# Patient Record
Sex: Male | Born: 1940 | Race: White | Hispanic: No | Marital: Married | Smoking: Never smoker
Health system: Southern US, Community
[De-identification: ages and names within clinical notes are randomized; demographics above are authoritative.]

## PROBLEM LIST (undated history)

## (undated) DIAGNOSIS — I491 Atrial premature depolarization: Secondary | ICD-10-CM

## (undated) DIAGNOSIS — E119 Type 2 diabetes mellitus without complications: Secondary | ICD-10-CM

## (undated) DIAGNOSIS — C801 Malignant (primary) neoplasm, unspecified: Secondary | ICD-10-CM

## (undated) DIAGNOSIS — Q231 Congenital insufficiency of aortic valve: Secondary | ICD-10-CM

## (undated) DIAGNOSIS — Z01810 Encounter for preprocedural cardiovascular examination: Secondary | ICD-10-CM

## (undated) DIAGNOSIS — I10 Essential (primary) hypertension: Secondary | ICD-10-CM

## (undated) DIAGNOSIS — N289 Disorder of kidney and ureter, unspecified: Secondary | ICD-10-CM

## (undated) DIAGNOSIS — M549 Dorsalgia, unspecified: Secondary | ICD-10-CM

## (undated) DIAGNOSIS — E7800 Pure hypercholesterolemia, unspecified: Secondary | ICD-10-CM

## (undated) DIAGNOSIS — I2510 Atherosclerotic heart disease of native coronary artery without angina pectoris: Secondary | ICD-10-CM

## (undated) DIAGNOSIS — M4804 Spinal stenosis, thoracic region: Secondary | ICD-10-CM

## (undated) DIAGNOSIS — R002 Palpitations: Secondary | ICD-10-CM

## (undated) DIAGNOSIS — E669 Obesity, unspecified: Secondary | ICD-10-CM

## (undated) DIAGNOSIS — J9602 Acute respiratory failure with hypercapnia: Secondary | ICD-10-CM

## (undated) DIAGNOSIS — J9601 Acute respiratory failure with hypoxia: Secondary | ICD-10-CM

## (undated) HISTORY — DX: Atrial premature depolarization: I49.1

## (undated) HISTORY — PX: NEPHRECTOMY: SHX65

## (undated) HISTORY — DX: Acute respiratory failure with hypercapnia: J96.02

## (undated) HISTORY — DX: Pure hypercholesterolemia, unspecified: E78.00

## (undated) HISTORY — DX: Essential (primary) hypertension: I10

## (undated) HISTORY — DX: Atherosclerotic heart disease of native coronary artery without angina pectoris: I25.10

## (undated) HISTORY — PX: HERNIA REPAIR: SHX51

## (undated) HISTORY — DX: Congenital insufficiency of aortic valve: Q23.1

## (undated) HISTORY — PX: ROTATOR CUFF REPAIR: SHX139

## (undated) HISTORY — DX: Acute respiratory failure with hypoxia: J96.01

## (undated) HISTORY — DX: Palpitations: R00.2

## (undated) HISTORY — DX: Obesity, unspecified: E66.9

## (undated) HISTORY — DX: Encounter for preprocedural cardiovascular examination: Z01.810

---

## 2014-03-03 DIAGNOSIS — I2510 Atherosclerotic heart disease of native coronary artery without angina pectoris: Secondary | ICD-10-CM | POA: Diagnosis not present

## 2014-03-03 DIAGNOSIS — I7090 Unspecified atherosclerosis: Secondary | ICD-10-CM | POA: Diagnosis not present

## 2014-03-03 DIAGNOSIS — C642 Malignant neoplasm of left kidney, except renal pelvis: Secondary | ICD-10-CM | POA: Diagnosis not present

## 2014-03-07 DIAGNOSIS — Z85528 Personal history of other malignant neoplasm of kidney: Secondary | ICD-10-CM | POA: Diagnosis not present

## 2014-03-28 DIAGNOSIS — M549 Dorsalgia, unspecified: Secondary | ICD-10-CM | POA: Diagnosis not present

## 2014-03-28 DIAGNOSIS — E119 Type 2 diabetes mellitus without complications: Secondary | ICD-10-CM | POA: Diagnosis not present

## 2014-03-28 DIAGNOSIS — G894 Chronic pain syndrome: Secondary | ICD-10-CM | POA: Diagnosis not present

## 2014-03-28 DIAGNOSIS — N183 Chronic kidney disease, stage 3 (moderate): Secondary | ICD-10-CM | POA: Diagnosis not present

## 2014-03-28 DIAGNOSIS — E785 Hyperlipidemia, unspecified: Secondary | ICD-10-CM | POA: Diagnosis not present

## 2014-04-05 DIAGNOSIS — E119 Type 2 diabetes mellitus without complications: Secondary | ICD-10-CM | POA: Diagnosis not present

## 2014-04-05 DIAGNOSIS — G510 Bell's palsy: Secondary | ICD-10-CM | POA: Diagnosis not present

## 2014-04-05 DIAGNOSIS — I10 Essential (primary) hypertension: Secondary | ICD-10-CM | POA: Diagnosis not present

## 2014-04-20 DIAGNOSIS — E109 Type 1 diabetes mellitus without complications: Secondary | ICD-10-CM | POA: Diagnosis not present

## 2014-04-25 DIAGNOSIS — M549 Dorsalgia, unspecified: Secondary | ICD-10-CM | POA: Diagnosis not present

## 2014-04-25 DIAGNOSIS — E785 Hyperlipidemia, unspecified: Secondary | ICD-10-CM | POA: Diagnosis not present

## 2014-04-25 DIAGNOSIS — Z79899 Other long term (current) drug therapy: Secondary | ICD-10-CM | POA: Diagnosis not present

## 2014-04-25 DIAGNOSIS — G894 Chronic pain syndrome: Secondary | ICD-10-CM | POA: Diagnosis not present

## 2014-04-25 DIAGNOSIS — I10 Essential (primary) hypertension: Secondary | ICD-10-CM | POA: Diagnosis not present

## 2014-05-12 DIAGNOSIS — I10 Essential (primary) hypertension: Secondary | ICD-10-CM | POA: Diagnosis not present

## 2014-05-12 DIAGNOSIS — E780 Pure hypercholesterolemia: Secondary | ICD-10-CM | POA: Diagnosis not present

## 2014-05-12 DIAGNOSIS — I2510 Atherosclerotic heart disease of native coronary artery without angina pectoris: Secondary | ICD-10-CM | POA: Diagnosis not present

## 2014-05-25 DIAGNOSIS — E119 Type 2 diabetes mellitus without complications: Secondary | ICD-10-CM | POA: Diagnosis not present

## 2014-05-25 DIAGNOSIS — I10 Essential (primary) hypertension: Secondary | ICD-10-CM | POA: Diagnosis not present

## 2014-05-25 DIAGNOSIS — E785 Hyperlipidemia, unspecified: Secondary | ICD-10-CM | POA: Diagnosis not present

## 2014-05-25 DIAGNOSIS — M549 Dorsalgia, unspecified: Secondary | ICD-10-CM | POA: Diagnosis not present

## 2014-05-25 DIAGNOSIS — G894 Chronic pain syndrome: Secondary | ICD-10-CM | POA: Diagnosis not present

## 2014-06-06 DIAGNOSIS — Z85528 Personal history of other malignant neoplasm of kidney: Secondary | ICD-10-CM | POA: Diagnosis not present

## 2014-06-22 DIAGNOSIS — Z79899 Other long term (current) drug therapy: Secondary | ICD-10-CM | POA: Diagnosis not present

## 2014-06-22 DIAGNOSIS — E119 Type 2 diabetes mellitus without complications: Secondary | ICD-10-CM | POA: Diagnosis not present

## 2014-06-22 DIAGNOSIS — I10 Essential (primary) hypertension: Secondary | ICD-10-CM | POA: Diagnosis not present

## 2014-06-22 DIAGNOSIS — M549 Dorsalgia, unspecified: Secondary | ICD-10-CM | POA: Diagnosis not present

## 2014-06-30 DIAGNOSIS — E780 Pure hypercholesterolemia: Secondary | ICD-10-CM | POA: Diagnosis not present

## 2014-07-19 DIAGNOSIS — E109 Type 1 diabetes mellitus without complications: Secondary | ICD-10-CM | POA: Diagnosis not present

## 2014-07-25 DIAGNOSIS — I10 Essential (primary) hypertension: Secondary | ICD-10-CM | POA: Diagnosis not present

## 2014-07-25 DIAGNOSIS — Z79899 Other long term (current) drug therapy: Secondary | ICD-10-CM | POA: Diagnosis not present

## 2014-07-25 DIAGNOSIS — E119 Type 2 diabetes mellitus without complications: Secondary | ICD-10-CM | POA: Diagnosis not present

## 2014-07-25 DIAGNOSIS — G894 Chronic pain syndrome: Secondary | ICD-10-CM | POA: Diagnosis not present

## 2014-07-25 DIAGNOSIS — E785 Hyperlipidemia, unspecified: Secondary | ICD-10-CM | POA: Diagnosis not present

## 2014-08-24 ENCOUNTER — Inpatient Hospital Stay (HOSPITAL_COMMUNITY): Payer: Medicare Other

## 2014-08-24 ENCOUNTER — Inpatient Hospital Stay (HOSPITAL_COMMUNITY)
Admission: EM | Admit: 2014-08-24 | Discharge: 2014-08-27 | DRG: 871 | Discharge status: Against Medical Advice | Payer: Medicare Other | Attending: Internal Medicine | Admitting: Internal Medicine

## 2014-08-24 ENCOUNTER — Encounter (HOSPITAL_COMMUNITY): Payer: Self-pay | Admitting: Emergency Medicine

## 2014-08-24 ENCOUNTER — Emergency Department (HOSPITAL_COMMUNITY): Payer: Medicare Other

## 2014-08-24 DIAGNOSIS — J189 Pneumonia, unspecified organism: Secondary | ICD-10-CM | POA: Diagnosis not present

## 2014-08-24 DIAGNOSIS — N179 Acute kidney failure, unspecified: Secondary | ICD-10-CM | POA: Diagnosis not present

## 2014-08-24 DIAGNOSIS — Z9889 Other specified postprocedural states: Secondary | ICD-10-CM | POA: Diagnosis not present

## 2014-08-24 DIAGNOSIS — S299XXA Unspecified injury of thorax, initial encounter: Secondary | ICD-10-CM | POA: Diagnosis not present

## 2014-08-24 DIAGNOSIS — I959 Hypotension, unspecified: Secondary | ICD-10-CM | POA: Diagnosis not present

## 2014-08-24 DIAGNOSIS — Z6841 Body Mass Index (BMI) 40.0 and over, adult: Secondary | ICD-10-CM | POA: Diagnosis not present

## 2014-08-24 DIAGNOSIS — Z905 Acquired absence of kidney: Secondary | ICD-10-CM | POA: Diagnosis not present

## 2014-08-24 DIAGNOSIS — G8929 Other chronic pain: Secondary | ICD-10-CM | POA: Diagnosis present

## 2014-08-24 DIAGNOSIS — J90 Pleural effusion, not elsewhere classified: Secondary | ICD-10-CM | POA: Diagnosis not present

## 2014-08-24 DIAGNOSIS — M6282 Rhabdomyolysis: Secondary | ICD-10-CM | POA: Diagnosis not present

## 2014-08-24 DIAGNOSIS — I248 Other forms of acute ischemic heart disease: Secondary | ICD-10-CM | POA: Diagnosis present

## 2014-08-24 DIAGNOSIS — Z85528 Personal history of other malignant neoplasm of kidney: Secondary | ICD-10-CM

## 2014-08-24 DIAGNOSIS — R0682 Tachypnea, not elsewhere classified: Secondary | ICD-10-CM | POA: Diagnosis not present

## 2014-08-24 DIAGNOSIS — I444 Left anterior fascicular block: Secondary | ICD-10-CM | POA: Diagnosis present

## 2014-08-24 DIAGNOSIS — I129 Hypertensive chronic kidney disease with stage 1 through stage 4 chronic kidney disease, or unspecified chronic kidney disease: Secondary | ICD-10-CM | POA: Diagnosis not present

## 2014-08-24 DIAGNOSIS — F17220 Nicotine dependence, chewing tobacco, uncomplicated: Secondary | ICD-10-CM | POA: Diagnosis present

## 2014-08-24 DIAGNOSIS — J9690 Respiratory failure, unspecified, unspecified whether with hypoxia or hypercapnia: Secondary | ICD-10-CM

## 2014-08-24 DIAGNOSIS — E872 Acidosis: Secondary | ICD-10-CM | POA: Diagnosis present

## 2014-08-24 DIAGNOSIS — M25551 Pain in right hip: Secondary | ICD-10-CM | POA: Diagnosis not present

## 2014-08-24 DIAGNOSIS — J9811 Atelectasis: Secondary | ICD-10-CM | POA: Diagnosis not present

## 2014-08-24 DIAGNOSIS — Z452 Encounter for adjustment and management of vascular access device: Secondary | ICD-10-CM | POA: Diagnosis not present

## 2014-08-24 DIAGNOSIS — R0602 Shortness of breath: Secondary | ICD-10-CM | POA: Diagnosis not present

## 2014-08-24 DIAGNOSIS — A419 Sepsis, unspecified organism: Secondary | ICD-10-CM | POA: Diagnosis not present

## 2014-08-24 DIAGNOSIS — J9601 Acute respiratory failure with hypoxia: Secondary | ICD-10-CM | POA: Diagnosis not present

## 2014-08-24 DIAGNOSIS — T40601A Poisoning by unspecified narcotics, accidental (unintentional), initial encounter: Secondary | ICD-10-CM | POA: Diagnosis not present

## 2014-08-24 DIAGNOSIS — J9602 Acute respiratory failure with hypercapnia: Secondary | ICD-10-CM | POA: Diagnosis not present

## 2014-08-24 DIAGNOSIS — G9341 Metabolic encephalopathy: Secondary | ICD-10-CM | POA: Diagnosis present

## 2014-08-24 DIAGNOSIS — R4182 Altered mental status, unspecified: Secondary | ICD-10-CM | POA: Diagnosis not present

## 2014-08-24 DIAGNOSIS — N189 Chronic kidney disease, unspecified: Secondary | ICD-10-CM | POA: Diagnosis not present

## 2014-08-24 DIAGNOSIS — E1122 Type 2 diabetes mellitus with diabetic chronic kidney disease: Secondary | ICD-10-CM | POA: Diagnosis not present

## 2014-08-24 DIAGNOSIS — R404 Transient alteration of awareness: Secondary | ICD-10-CM | POA: Diagnosis not present

## 2014-08-24 DIAGNOSIS — R531 Weakness: Secondary | ICD-10-CM | POA: Diagnosis not present

## 2014-08-24 DIAGNOSIS — S0990XA Unspecified injury of head, initial encounter: Secondary | ICD-10-CM | POA: Diagnosis not present

## 2014-08-24 DIAGNOSIS — M4804 Spinal stenosis, thoracic region: Secondary | ICD-10-CM | POA: Diagnosis present

## 2014-08-24 DIAGNOSIS — S79911A Unspecified injury of right hip, initial encounter: Secondary | ICD-10-CM | POA: Diagnosis not present

## 2014-08-24 HISTORY — DX: Disorder of kidney and ureter, unspecified: N28.9

## 2014-08-24 HISTORY — DX: Acute respiratory failure with hypoxia: J96.01

## 2014-08-24 HISTORY — DX: Malignant (primary) neoplasm, unspecified: C80.1

## 2014-08-24 HISTORY — DX: Essential (primary) hypertension: I10

## 2014-08-24 HISTORY — DX: Type 2 diabetes mellitus without complications: E11.9

## 2014-08-24 HISTORY — DX: Dorsalgia, unspecified: M54.9

## 2014-08-24 HISTORY — DX: Spinal stenosis, thoracic region: M48.04

## 2014-08-24 LAB — URINALYSIS, ROUTINE W REFLEX MICROSCOPIC
Bilirubin Urine: NEGATIVE
Bilirubin Urine: NEGATIVE
GLUCOSE, UA: NEGATIVE mg/dL
Glucose, UA: 100 mg/dL — AB
Ketones, ur: NEGATIVE mg/dL
Ketones, ur: NEGATIVE mg/dL
LEUKOCYTES UA: NEGATIVE
Leukocytes, UA: NEGATIVE
NITRITE: NEGATIVE
Nitrite: NEGATIVE
PH: 5.5 (ref 5.0–8.0)
Protein, ur: 100 mg/dL — AB
Protein, ur: 100 mg/dL — AB
SPECIFIC GRAVITY, URINE: 1.008 (ref 1.005–1.030)
Specific Gravity, Urine: 1.02 (ref 1.005–1.030)
Urobilinogen, UA: 1 mg/dL (ref 0.0–1.0)
Urobilinogen, UA: 0.2 mg/dL (ref 0.0–1.0)
pH: 5 (ref 5.0–8.0)

## 2014-08-24 LAB — I-STAT ARTERIAL BLOOD GAS, ED
Acid-base deficit: 8 mmol/L — ABNORMAL HIGH (ref 0.0–2.0)
Acid-base deficit: 5 mmol/L — ABNORMAL HIGH (ref 0.0–2.0)
Bicarbonate: 21.3 mEq/L (ref 20.0–24.0)
Bicarbonate: 24.9 mEq/L — ABNORMAL HIGH (ref 20.0–24.0)
O2 Saturation: 90 %
O2 Saturation: 100 %
PCO2 ART: 64.8 mmHg — AB (ref 35.0–45.0)
PH ART: 7.195 — AB (ref 7.350–7.450)
Patient temperature: 99.4
TCO2: 23 mmol/L (ref 0–100)
TCO2: 27 mmol/L (ref 0–100)
pCO2 arterial: 55.2 mmHg — ABNORMAL HIGH (ref 35.0–45.0)
pH, Arterial: 7.196 — CL (ref 7.350–7.450)
pO2, Arterial: 73 mmHg — ABNORMAL LOW (ref 80.0–100.0)
pO2, Arterial: 261 mmHg — ABNORMAL HIGH (ref 80.0–100.0)

## 2014-08-24 LAB — COMPREHENSIVE METABOLIC PANEL
ALT: 26 U/L (ref 17–63)
AST: 77 U/L — ABNORMAL HIGH (ref 15–41)
Albumin: 3.6 g/dL (ref 3.5–5.0)
Alkaline Phosphatase: 63 U/L (ref 38–126)
Anion gap: 13 (ref 5–15)
BUN: 27 mg/dL — ABNORMAL HIGH (ref 6–20)
CO2: 20 mmol/L — ABNORMAL LOW (ref 22–32)
Calcium: 8.8 mg/dL — ABNORMAL LOW (ref 8.9–10.3)
Chloride: 105 mmol/L (ref 101–111)
Creatinine, Ser: 3.18 mg/dL — ABNORMAL HIGH (ref 0.61–1.24)
GFR calc Af Amer: 21 mL/min — ABNORMAL LOW (ref >60)
GFR calc non Af Amer: 18 mL/min — ABNORMAL LOW (ref >60)
Glucose, Bld: 147 mg/dL — ABNORMAL HIGH (ref 65–99)
Potassium: 4.6 mmol/L (ref 3.5–5.1)
Sodium: 138 mmol/L (ref 135–145)
Total Bilirubin: 0.6 mg/dL (ref 0.3–1.2)
Total Protein: 7.2 g/dL (ref 6.5–8.1)

## 2014-08-24 LAB — LACTIC ACID, PLASMA: LACTIC ACID, VENOUS: 1.4 mmol/L (ref 0.5–2.0)

## 2014-08-24 LAB — I-STAT TROPONIN, ED: Troponin i, poc: 0.13 ng/mL (ref 0.00–0.08)

## 2014-08-24 LAB — CBC WITH DIFFERENTIAL/PLATELET
Basophils Absolute: 0.1 10*3/uL (ref 0.0–0.1)
Basophils Relative: 0 % (ref 0–1)
Eosinophils Absolute: 0.1 10*3/uL (ref 0.0–0.7)
Eosinophils Relative: 0 % (ref 0–5)
HCT: 48.1 % (ref 39.0–52.0)
Hemoglobin: 15.5 g/dL (ref 13.0–17.0)
Lymphocytes Relative: 13 % (ref 12–46)
Lymphs Abs: 2 10*3/uL (ref 0.7–4.0)
MCH: 28 pg (ref 26.0–34.0)
MCHC: 32.2 g/dL (ref 30.0–36.0)
MCV: 86.8 fL (ref 78.0–100.0)
Monocytes Absolute: 1.1 10*3/uL — ABNORMAL HIGH (ref 0.1–1.0)
Monocytes Relative: 7 % (ref 3–12)
Neutro Abs: 12.5 10*3/uL — ABNORMAL HIGH (ref 1.7–7.7)
Neutrophils Relative %: 80 % — ABNORMAL HIGH (ref 43–77)
Platelets: 200 10*3/uL (ref 150–400)
RBC: 5.54 MIL/uL (ref 4.22–5.81)
RDW: 14.3 % (ref 11.5–15.5)
WBC: 15.8 10*3/uL — ABNORMAL HIGH (ref 4.0–10.5)

## 2014-08-24 LAB — CARBOXYHEMOGLOBIN
Carboxyhemoglobin: 0.9 % (ref 0.5–1.5)
METHEMOGLOBIN: 1.8 % — AB (ref 0.0–1.5)
O2 Saturation: 74.1 %
TOTAL HEMOGLOBIN: 14.6 g/dL (ref 13.5–18.0)

## 2014-08-24 LAB — GLUCOSE, CAPILLARY
GLUCOSE-CAPILLARY: 135 mg/dL — AB (ref 65–99)
Glucose-Capillary: 158 mg/dL — ABNORMAL HIGH (ref 65–99)
Glucose-Capillary: 162 mg/dL — ABNORMAL HIGH (ref 65–99)

## 2014-08-24 LAB — URINE MICROSCOPIC-ADD ON

## 2014-08-24 LAB — CK: Total CK: 7042 U/L — ABNORMAL HIGH (ref 49–397)

## 2014-08-24 LAB — RAPID URINE DRUG SCREEN, HOSP PERFORMED
Amphetamines: NOT DETECTED
Barbiturates: NOT DETECTED
Benzodiazepines: POSITIVE — AB
Cocaine: NOT DETECTED
Opiates: POSITIVE — AB
Tetrahydrocannabinol: NOT DETECTED

## 2014-08-24 LAB — TSH: TSH: 1.809 u[IU]/mL (ref 0.350–4.500)

## 2014-08-24 LAB — TROPONIN I
TROPONIN I: 0.74 ng/mL — AB (ref <0.031)
Troponin I: 0.39 ng/mL — ABNORMAL HIGH (ref <0.031)

## 2014-08-24 LAB — PROCALCITONIN: Procalcitonin: 0.14 ng/mL

## 2014-08-24 LAB — I-STAT CG4 LACTIC ACID, ED: Lactic Acid, Venous: 6.62 mmol/L (ref 0.5–2.0)

## 2014-08-24 MED ORDER — FENTANYL BOLUS VIA INFUSION
25.0000 ug | INTRAVENOUS | Status: DC | PRN
  Filled 2014-08-24: qty 25

## 2014-08-24 MED ORDER — SODIUM CHLORIDE 0.9 % IV SOLN: 250.0000 mL | INTRAVENOUS | Status: DC | PRN

## 2014-08-24 MED ORDER — SODIUM CHLORIDE 0.9 % IV SOLN
2.0000 mg/h | INTRAVENOUS | Status: DC
  Administered 2014-08-24: 2 mg/h via INTRAVENOUS
  Filled 2014-08-24: qty 10

## 2014-08-24 MED ORDER — CETYLPYRIDINIUM CHLORIDE 0.05 % MT LIQD
7.0000 mL | Freq: Four times a day (QID) | OROMUCOSAL | Status: DC
  Administered 2014-08-25 (×2): 7 mL via OROMUCOSAL

## 2014-08-24 MED ORDER — ROCURONIUM BROMIDE 50 MG/5ML IV SOLN
INTRAVENOUS | Status: AC
  Filled 2014-08-24: qty 2

## 2014-08-24 MED ORDER — SODIUM CHLORIDE 0.9 % IV SOLN
25.0000 ug/h | INTRAVENOUS | Status: DC
  Administered 2014-08-24: 25 ug/h via INTRAVENOUS
  Filled 2014-08-24: qty 50

## 2014-08-24 MED ORDER — FENTANYL CITRATE (PF) 100 MCG/2ML IJ SOLN: 50.0000 ug | Freq: Once | INTRAMUSCULAR | Status: DC

## 2014-08-24 MED ORDER — INSULIN ASPART 100 UNIT/ML ~~LOC~~ SOLN
0.0000 [IU] | SUBCUTANEOUS | Status: DC
  Administered 2014-08-24: 3 [IU] via SUBCUTANEOUS
  Administered 2014-08-24: 2 [IU] via SUBCUTANEOUS
  Administered 2014-08-25: 3 [IU] via SUBCUTANEOUS
  Administered 2014-08-25 – 2014-08-26 (×4): 2 [IU] via SUBCUTANEOUS

## 2014-08-24 MED ORDER — LIDOCAINE HCL (CARDIAC) 20 MG/ML IV SOLN
INTRAVENOUS | Status: AC
  Filled 2014-08-24: qty 5

## 2014-08-24 MED ORDER — SODIUM CHLORIDE 0.9 % IV BOLUS (SEPSIS)
1000.0000 mL | Freq: Once | INTRAVENOUS | Status: AC
  Administered 2014-08-24: 1000 mL via INTRAVENOUS

## 2014-08-24 MED ORDER — FENTANYL CITRATE (PF) 100 MCG/2ML IJ SOLN
25.0000 ug | Freq: Once | INTRAMUSCULAR | Status: AC
  Administered 2014-08-24: 25 ug via INTRAVENOUS
  Filled 2014-08-24: qty 2

## 2014-08-24 MED ORDER — PIPERACILLIN-TAZOBACTAM 3.375 G IVPB 30 MIN
3.3750 g | Freq: Once | INTRAVENOUS | Status: AC
  Administered 2014-08-24: 3.375 g via INTRAVENOUS
  Filled 2014-08-24: qty 50

## 2014-08-24 MED ORDER — VANCOMYCIN HCL 10 G IV SOLR: 1750.0000 mg | INTRAVENOUS | Status: DC

## 2014-08-24 MED ORDER — SODIUM CHLORIDE 0.9 % IV SOLN
INTRAVENOUS | Status: DC
  Administered 2014-08-24: 12:00:00 via INTRAVENOUS
  Administered 2014-08-25: 125 mL/h via INTRAVENOUS
  Administered 2014-08-25: 01:00:00 via INTRAVENOUS

## 2014-08-24 MED ORDER — FAMOTIDINE IN NACL 20-0.9 MG/50ML-% IV SOLN
20.0000 mg | INTRAVENOUS | Status: DC
  Administered 2014-08-24: 20 mg via INTRAVENOUS
  Filled 2014-08-24 (×2): qty 50

## 2014-08-24 MED ORDER — ETOMIDATE 2 MG/ML IV SOLN
INTRAVENOUS | Status: AC
  Administered 2014-08-24: 10 mg
  Filled 2014-08-24: qty 20

## 2014-08-24 MED ORDER — SUCCINYLCHOLINE CHLORIDE 20 MG/ML IJ SOLN
INTRAMUSCULAR | Status: AC
Start: 2014-08-24 — End: 2014-08-24
  Administered 2014-08-24: 10 mg
  Filled 2014-08-24: qty 1

## 2014-08-24 MED ORDER — MIDAZOLAM HCL 2 MG/2ML IJ SOLN: 1.0000 mg | INTRAMUSCULAR | Status: DC | PRN

## 2014-08-24 MED ORDER — SODIUM CHLORIDE 0.9 % IV BOLUS (SEPSIS)
1000.0000 mL | INTRAVENOUS | Status: AC
  Administered 2014-08-24 (×3): 1000 mL via INTRAVENOUS

## 2014-08-24 MED ORDER — VANCOMYCIN HCL IN DEXTROSE 1-5 GM/200ML-% IV SOLN
1000.0000 mg | Freq: Once | INTRAVENOUS | Status: DC
  Filled 2014-08-24: qty 200

## 2014-08-24 MED ORDER — SODIUM CHLORIDE 0.9 % IV BOLUS (SEPSIS)
500.0000 mL | INTRAVENOUS | Status: AC
  Administered 2014-08-24: 500 mL via INTRAVENOUS

## 2014-08-24 MED ORDER — SODIUM CHLORIDE 0.9 % IV SOLN
25.0000 ug/h | INTRAVENOUS | Status: DC
  Filled 2014-08-24: qty 50

## 2014-08-24 MED ORDER — NOREPINEPHRINE BITARTRATE 1 MG/ML IV SOLN
2.0000 ug/kg/min | Freq: Once | INTRAVENOUS | Status: DC
  Filled 2014-08-24: qty 250

## 2014-08-24 MED ORDER — NOREPINEPHRINE BITARTRATE 1 MG/ML IV SOLN
0.0000 ug/min | INTRAVENOUS | Status: DC
  Administered 2014-08-24: 10 ug/min via INTRAVENOUS
  Filled 2014-08-24: qty 4

## 2014-08-24 MED ORDER — VANCOMYCIN HCL 10 G IV SOLR
2000.0000 mg | Freq: Once | INTRAVENOUS | Status: AC
  Administered 2014-08-24: 2000 mg via INTRAVENOUS
  Filled 2014-08-24: qty 2000

## 2014-08-24 MED ORDER — MIDAZOLAM HCL 2 MG/2ML IJ SOLN
2.0000 mg | Freq: Once | INTRAMUSCULAR | Status: AC
  Administered 2014-08-24: 2 mg via INTRAVENOUS
  Filled 2014-08-24: qty 2

## 2014-08-24 MED ORDER — HEPARIN SODIUM (PORCINE) 5000 UNIT/ML IJ SOLN
5000.0000 [IU] | Freq: Three times a day (TID) | INTRAMUSCULAR | Status: DC
  Administered 2014-08-24 – 2014-08-27 (×7): 5000 [IU] via SUBCUTANEOUS
  Filled 2014-08-24 (×11): qty 1

## 2014-08-24 MED ORDER — PIPERACILLIN-TAZOBACTAM 3.375 G IVPB
3.3750 g | Freq: Three times a day (TID) | INTRAVENOUS | Status: DC
  Administered 2014-08-24 – 2014-08-26 (×5): 3.375 g via INTRAVENOUS
  Filled 2014-08-24 (×7): qty 50

## 2014-08-24 MED ORDER — CHLORHEXIDINE GLUCONATE 0.12 % MT SOLN
15.0000 mL | Freq: Two times a day (BID) | OROMUCOSAL | Status: DC
  Administered 2014-08-24 – 2014-08-25 (×2): 15 mL via OROMUCOSAL
  Filled 2014-08-24: qty 15

## 2014-08-24 NOTE — ED Notes (Signed)
Slid out of chair this morning to floor sometime between 0200-0500 this morning; weak, clammy and drowsy on EMS arrival. Takes Opana, Oxy, and Xanax. EMS spent an hour trying to convince him to come to hospital as he did not want to go, but was too drowsy to maintain conversation without waking him up. Very drowsy, pupils constricted. Given Narcan at 0925. Slight improvement per EMS. Arrives drowsy, sats 88% on RA, opens eyes to speech, oriented x 4 to questions, returns to sleeping.

## 2014-08-24 NOTE — ED Notes (Signed)
Reduce Levo to 72mcg/min per Dr. Shearon Stalls.

## 2014-08-24 NOTE — Progress Notes (Signed)
Patient intubated by MD without any complications.  Bilateral breath sounds noted.  Positive color change.  Chest xray pending for confirmation of tube placement.  Will obtain follow up ABG.  Will continue to monitor.

## 2014-08-24 NOTE — Progress Notes (Signed)
   08/24/14 1200  Clinical Encounter Type  Visited With Patient and family together  Visit Type Initial;Spiritual support;Social support;Critical Care;ED  Referral From Nurse  Stress Factors  Family Stress Factors Health changes   Chaplain was paged to patient's room in the ED at 11:55 AM. Chaplain was notified that the patient's medical condition had declined rapidly and that the patient had to be intubated. When chaplain arrived, patient's family was present and either at the bedside or consultation room A. Critical Care was meeting with patient's family when chaplain arrived as well. Chaplain introduced himself to one of the patient's daughters. Chaplain assisted family back and forth from consultation room to bedside. Patient's daughter explained that the patient fell this morning, and despite declining health the patient really did not want to go to the hospital. Most of the patient's family is now in the consultation room, awaiting the patient to be admitted to a unit. No further support needs at this time. Page On-Call chaplain if patient's family needs further support today.  Gar Ponto, Chaplain  12:45 PM

## 2014-08-24 NOTE — H&P (Signed)
PULMONARY / CRITICAL CARE MEDICINE   Name: John Tucker MRN: [id number] DOB: 1940/11/20    ADMISSION DATE:  08/24/2014 CONSULTATION DATE:  08/24/2014  REFERRING MD :  EDP  CHIEF COMPLAINT:  AMS  INITIAL PRESENTATION:  74 y.o. M brought to [facility] ED 08/24/14 with AMS and hypoxia.  In ED, he required intubation and later had hypotension that was refractory to IVF resuscitation.  PCCM called for admission.    STUDIES:  CXR 7/6 >>> small right effusion / atx. CT head 7/6 >>> no acute process.  SIGNIFICANT EVENTS: 7/6 - admit, intubated for airway protection.   HISTORY OF PRESENT ILLNESS:  Pt is encephalopathic; therefore, this HPI is obtained from chart review. John Tucker is a 74 y.o. M with PMH of spinal stenosis, chronic back pain, HTN, Renal Cell Carcinoma s/p left nephrectomy 2015, and DM.  He was brought to [facility] ED 08/23/13 after his wife found him on the bedroom floor around 7AM that morning.  She stated that he was lying on the floor and was diaphoretic.  He was drowsy but aroused to voice.  She called EMS due to not being able to get him up.  On EMS arrival, pt was very drowsy with pinpoint pupils.  He was apparently given narcan x 2 without response. On ED arrival, pt was hypoxic to SpO2 88%.  Pt was able to open his eyes to speech and was A&O x 4 initially; however, later his mental status declined and pt was unable to answer questions appropriately and had very shallow respirations.  ABG revealed respiratory acidosis and pt was subsequently intubated for airway protection.  BP prior to intubation was borderline with SBP of 105 and following intubation, it dropped into SBP's of 70 - 80's.  Initial lactate was 6; therefore, code sepsis was activated and pt was given 4L IVF without much change in BP.  Wife states that pt had been in his USOH prior to events noted above.  With the exception of a cough with occasional sputum production and 1 episode of diarrhea, pt had been fully asymptomatic.  She  did not know of any fevers/chills/sweats, chest pain, SOB, N/V/abd pain, dysuria, myalgias.  Note, urine in foley collection bag has pink colored tinge to it.  Wife reports that pt did not mention any discoloration to urine over past few days.  She is unsure how long he was down for this morning, possibly 4 - 5 hours.   PAST MEDICAL HISTORY :   has a past medical history of Spinal stenosis, thoracic; Renal disorder; Hypertension; Cancer; Back pain; and Diabetes mellitus without complication.  has past surgical history that includes Nephrectomy. Prior to Admission medications   Not on File   No Known Allergies  FAMILY HISTORY:  History reviewed. No pertinent family history.  SOCIAL HISTORY:  reports that he has never smoked. His smokeless tobacco use includes Snuff. He reports that he does not drink alcohol or use illicit drugs.  REVIEW OF SYSTEMS:  Unable to obtain as pt is encephalopathic.  SUBJECTIVE:   VITAL SIGNS: Temp:  [99.4 F (37.4 C)] 99.4 F (37.4 C) (07/06 1005) Pulse Rate:  [73-94] 80 (07/06 1358) Resp:  [10-26] 26 (07/06 1358) BP: (71-149)/(39-113) 149/113 mmHg (07/06 1358) SpO2:  [86 %-100 %] 100 % (07/06 1310) FiO2 (%):  [60 %-100 %] 60 % (07/06 1358) Weight:  [131.543 kg (290 lb)] 131.543 kg (290 lb) (07/06 1043) HEMODYNAMICS:   VENTILATOR SETTINGS: Vent Mode:  [-] PRVC FiO2 (%):  [  60 %-100 %] 60 % Set Rate:  [18 bmp-26 bmp] 26 bmp Vt Set:  [500 mL] 500 mL PEEP:  [5 cmH20] 5 cmH20 Plateau Pressure:  [21 cmH20-22 cmH20] 21 cmH20 INTAKE / OUTPUT: Intake/Output    None     PHYSICAL EXAMINATION: General: Adult male, in NAD. Neuro: Sedated, does not follow commands.  Opens eyes and withdraws to physical stimuli. HEENT: [place]/AT. PERRL, sclerae anicteric. Cardiovascular: RRR, no M/R/G.  Lungs: Respirations even and unlabored.  CTA bilaterally, No W/R/R.  Abdomen: Obese, BS x 4, soft, NT/ND.  Musculoskeletal: No gross deformities, 1+ LE pitting  edema. Skin: Intact, warm, no rashes.  LABS:  CBC  Recent Labs Lab 08/24/14 1000  WBC 15.8*  HGB 15.5  HCT 48.1  PLT 200   Coag's No results for input(s): APTT, INR in the last 168 hours. BMET  Recent Labs Lab 08/24/14 1000  NA 138  K 4.6  CL 105  CO2 20*  BUN 27*  CREATININE 3.18*  GLUCOSE 147*   Electrolytes  Recent Labs Lab 08/24/14 1000  CALCIUM 8.8*   Sepsis Markers  Recent Labs Lab 08/24/14 1019  LATICACIDVEN 6.62*   ABG  Recent Labs Lab 08/24/14 1008 08/24/14 1133  PHART 7.196* 7.195*  PCO2ART 55.2* 64.8*  PO2ART 73.0* 261.0*   Liver Enzymes  Recent Labs Lab 08/24/14 1000  AST 77*  ALT 26  ALKPHOS 63  BILITOT 0.6  ALBUMIN 3.6   Cardiac Enzymes No results for input(s): TROPONINI, PROBNP in the last 168 hours. Glucose No results for input(s): GLUCAP in the last 168 hours.  Imaging Ct Head Wo Contrast  08/24/2014   CLINICAL DATA:  Slipped out of chair and onto the floor between 2 and 5 a.m. this morning, drowsy and week, constricted pupils, intubated, concern for drug overdose or injury related to fall  EXAM: CT HEAD WITHOUT CONTRAST  TECHNIQUE: Contiguous axial images were obtained from the base of the skull through the vertex without intravenous contrast.  COMPARISON:  06/11/2013  FINDINGS: Mild to moderate diffuse cortical atrophy, age-related. No hemorrhage or extra-axial fluid. No mass, infarct, or hydrocephalus. Patient is intubated. Right maxillary sinus is opacified. There is mild inflammatory change in numerous ethmoid air cells. No evidence of skull fracture.  IMPRESSION: No acute findings. Negative except for age-related atrophy and sinus inflammation.   Electronically Signed   By: Skipper Cliche M.D.   On: 08/24/2014 13:44   Dg Chest Portable 1 View  08/24/2014   CLINICAL DATA:  Central catheter placement  EXAM: PORTABLE CHEST - 1 VIEW  COMPARISON:  Study obtained earlier in the day  FINDINGS: There is no central catheter with  the tip along the right lateral wall of the superior vena cava. Endotracheal tube tip is 3.0 cm above the carina. Nasogastric tube tip and side port are in the stomach. No pneumothorax. There is patchy airspace consolidation in the right base with small right effusion. There is a minimal left effusion with slight atelectasis in the left base. Lungs are otherwise clear. Heart is slightly enlarged but stable.  IMPRESSION: Tube and catheter positions as described without pneumothorax. Patchy airspace opacity in the right base with small right effusion. Minimal left effusion with mild left base atelectasis. No change in cardiac silhouette.   Electronically Signed   By: Lowella Grip III M.D.   On: 08/24/2014 12:55   Dg Chest Portable 1 View  08/24/2014   CLINICAL DATA:  Hypoxia  EXAM: PORTABLE CHEST - 1 VIEW  COMPARISON:  August 24, 2014  FINDINGS: Endotracheal tube tip is 2.4 cm above the carina. Nasogastric tube tip and side port are below the diaphragm. No pneumothorax. There is atelectatic change in the right base. The lungs elsewhere clear. Heart is borderline enlarged with pulmonary vascularity within normal limits. No adenopathy. There is old trauma involving the lateral left clavicle, stable.  IMPRESSION: Tube positions as described without pneumothorax. Atelectatic change right base, slightly increased from earlier in the day. Stable cardiac prominence.   Electronically Signed   By: Lowella Grip III M.D.   On: 08/24/2014 11:16   Dg Chest Portable 1 View  08/24/2014   CLINICAL DATA:  Patient slid out of chair, very short of breath, RIGHT hip pain, altered mental status  EXAM: PORTABLE CHEST - 1 VIEW  COMPARISON:  Portable exam 1011 hours compared to 11/04/2013  FINDINGS: Enlargement of cardiac silhouette.  Atherosclerotic calcification aorta.  Mediastinal contours and pulmonary vascularity normal.  Mild elevation of RIGHT diaphragm with RIGHT basilar atelectasis.  Minimal central peribronchial  thickening.  No definite infiltrate, pleural effusion or pneumothorax.  IMPRESSION: Enlargement of cardiac silhouette.  Bronchitic changes with RIGHT basilar atelectasis.   Electronically Signed   By: Lavonia Dana M.D.   On: 08/24/2014 10:34   Dg Hip Port Unilat With Pelvis 1v Right  08/24/2014   CLINICAL DATA:  Acute right hip pain after sliding out of a chair. Initial encounter.  EXAM: RIGHT HIP (WITH PELVIS) 1 VIEW PORTABLE  COMPARISON:  None.  FINDINGS: No acute fracture is identified. The right hip is located. Mild bilateral hip osteoarthrosis is noted. No lytic or blastic osseous lesion is identified.  IMPRESSION: No acute osseous abnormality identified.   Electronically Signed   By: Logan Bores   On: 08/24/2014 10:34    ASSESSMENT / PLAN:  PULMONARY OETT 7/6 >>> A: Acute hypoxic and hypercarbic respiratory failure - s/p intubation 7/6. Respiratory acidosis. Atelectasis. P:   Full mechanical support, wean as able. ABG noted, increase MV. VAP bundle. SBT in AM if able. CXR in AM.  CARDIOVASCULAR CVL L IJ 7/6 >>> A:  Hypotension - ? Secondary to sedation post intubation vs shock.  Troponin leak - suspect demand ischemia. Hx HTN. P:  Levophed PRN to maintain goal MAP > 65. Goal CVP 8 - 12. Trend troponin / lactate. Consider echo.  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 AGMA - lactate. Rhabdomyolysis. Pseudohypocalcemia - corrects to 9.1. Hx RCC s/p left nephrectomy 2015. P:   NS @ 125. Repeat lactate. Repeat CK in AM. Send ionized calcium. BMP in AM.  GASTROINTESTINAL A:   GI prophylaxis. Nutrition. P:   SUP: Pantoprazole. NPO.  HEMATOLOGIC / ONCOLOGIC A:   Hx RCC s/p left nephrectomy 2015. VTE Prophylaxis. P:  SCD's / Heparin. CBC in AM.  INFECTIOUS A:   Concern for septic shock of unclear etiology - doubtful but will consider for now. P:   BCx2 7/6 > UCx 7/6 > Sputum Cx 7/6 > Abx: Vanc, start date  7/6, day 1/x. Abx: Zosyn, start date 7/6, day 1/x. Check PCT, if low then consider early d/c abx.  ENDOCRINE A:   DM.   P:   SSI. Check TSH.  NEUROLOGIC A:   Acute metabolic encephalopathy. Hx chronic back pain, spinal stenosis - on chronic opiates and benzo's. UDS positive for opiates and benzo's (has Rx for these). P:   Sedation:  Fentanyl gtt / Midazolam PRN.  RASS goal: 0 to -1. Daily WUA.   Family updated: Wife and daughters at bedside.  Interdisciplinary Family Meeting v Palliative Care Meeting:  Due by: 08/30/14.   Montey Hora, [place] [facility] Pager: [phone]  or [phone] 08/24/2014, 2:04 PM  PCCM ATTENDING: I have reviewed pt's initial presentation, consultants notes and hospital database in detail.  The above assessment and plan was formulated under my direction.  In summary: 46 M admitted via ED with AMS, acute respiratory failure, possible inadvertent overdose of prescription meds, AKI. Intubated in ED. Requiring vasopressors to maintain MAP goal of 65 mmHg. CT head negative. CXR with RLL PNA - treating as PNA. He is coming around neurologically and was F/C for RN this evening. Uo is good. I have updated wife and family.    40 minutes of independent CCM time was provided by me   Merton Border, MD;  PCCM service; Mobile [phone]

## 2014-08-24 NOTE — ED Notes (Signed)
Successful intubation by Lawyer, PA and Ray, MD at 1057. 8.0 ETT at 26 at lip.

## 2014-08-24 NOTE — Progress Notes (Signed)
Post intubation ABG results.  Ventilator adjustments made.   Ref. Range 08/24/2014 11:33  Sample type Unknown ARTERIAL  pH, Arterial Latest Ref Range: 7.350-7.450  7.195 (LL)  pCO2 arterial Latest Ref Range: 35.0-45.0 mmHg 64.8 (HH)  pO2, Arterial Latest Ref Range: 80.0-100.0 mmHg 261.0 (H)  Bicarbonate Latest Ref Range: 20.0-24.0 mEq/L 24.9 (H)  TCO2 Latest Ref Range: 0-100 mmol/L 27  Acid-base deficit Latest Ref Range: 0.0-2.0 mmol/L 5.0 (H)  O2 Saturation Latest Units: % 100.0  Patient temperature Unknown 99.4 F  Collection site Unknown RADIAL, ALLEN'S T.Marland KitchenMarland Kitchen

## 2014-08-24 NOTE — Progress Notes (Signed)
ANTIBIOTIC CONSULT NOTE - INITIAL  Pharmacy Consult:  Vancomycin / Zosyn Indication:  Sepsis  Allergies not on file  Patient Measurements: Height: 5' 8.5" (174 cm) Weight: 290 lb (131.543 kg) IBW/kg (Calculated) : 69.55  Vital Signs: Temp: 99.4 F (37.4 C) (07/06 1005) Temp Source: Rectal (07/06 1005) BP: 106/50 mmHg (07/06 1030) Pulse Rate: 85 (07/06 1030)  Labs:  Recent Labs  08/24/14 1000  WBC 15.8*  HGB 15.5  PLT 200  CREATININE 3.18*   Estimated Creatinine Clearance: 28 mL/min (by C-G formula based on Cr of 3.18). No results for input(s): VANCOTROUGH, VANCOPEAK, VANCORANDOM, GENTTROUGH, GENTPEAK, GENTRANDOM, TOBRATROUGH, TOBRAPEAK, TOBRARND, AMIKACINPEAK, AMIKACINTROU, AMIKACIN in the last 72 hours.   Microbiology: No results found for this or any previous visit (from the past 720 hour(s)).  Medical History: No past medical history on file.    Assessment: 8 YOM with unknown PMH and allergies to start on vancomycin and Zosyn for sepsis.  Noted patient has elevated SCr (baseline renal function is unknown).   Goal of Therapy:  Vancomycin trough level 15-20 mcg/ml   Plan:  - Vanc 2gm IV x 1, then 1750mg  IV Q48H - Zosyn 3.375gm IV x 1 (infuse over 30 min), then 3.375gm IV Q8H, 4 hr infusion - Monitor renal fxn, clinical progress, vanc trough at Css    Airianna Kreischer D. Mina Marble, PharmD, BCPS Pager:  [phone] 08/24/2014, 11:05 AM

## 2014-08-24 NOTE — ED Notes (Signed)
To give bedside report on arrival to 2M. Transporting to CT at this time.

## 2014-08-24 NOTE — ED Provider Notes (Signed)
11:33 AM Discussed with Dr. Chase Caller.  Pattricia Boss, MD 08/26/14 [phone]

## 2014-08-24 NOTE — Progress Notes (Signed)
[place] Progress Note Patient Name: John Tucker DOB: 1940/05/27 MRN: [id number]   Date of Service  08/24/2014  HPI/Events of Note  NSVT - 17 beat run.   eICU Interventions  Will order: 1. BMP and Magnesium level now.      Intervention Category Intermediate Interventions: Arrhythmia - evaluation and management  Rashada Klontz [place] 08/24/2014, 11:54 PM

## 2014-08-24 NOTE — Procedures (Signed)
Central Venous Catheter Insertion Procedure Note John Tucker [id number] 1940-09-19  Procedure: Insertion of Central Venous Catheter Indications: Assessment of intravascular volume, Drug and/or fluid administration and Frequent blood sampling  Procedure Details Consent: Risks of procedure as well as the alternatives and risks of each were explained to the (patient/caregiver).  Consent for procedure obtained. Time Out: Verified patient identification, verified procedure, site/side was marked, verified correct patient position, special equipment/implants available, medications/allergies/relevent history reviewed, required imaging and test results available.  Performed  Maximum sterile technique was used including antiseptics, cap, gloves, gown, hand hygiene, mask and sheet. Skin prep: Chlorhexidine; local anesthetic administered A antimicrobial bonded/coated triple lumen catheter was placed in the left internal jugular vein using the Seldinger technique.  Evaluation Blood flow good Complications: No apparent complications Patient did tolerate procedure well. Chest X-ray ordered to verify placement.  CXR: pending.  Procedure performed under direct ultrasound guidance for real time vessel cannulation.      Montey Hora, [place] [facility] Pager: [phone]  or [phone] 08/24/2014, 2:03 PM   I was present for and supervised the entire procedure  Merton Border, MD ; [facility] service Mobile [phone].  After 5:30 PM or weekends, call [phone]

## 2014-08-24 NOTE — ED Notes (Signed)
Family arrived; history and allergies charted.

## 2014-08-24 NOTE — Progress Notes (Signed)
Patient transported to 2M06 without any complications.  Also sputum sample obtained and sent down to main lab without any complications.

## 2014-08-24 NOTE — ED Provider Notes (Signed)
CSN: [id number]     Arrival date & time 08/24/14  1540 History   First MD Initiated Contact with Patient 08/24/14 [phone]     Chief Complaint  Patient presents with  . Altered Mental Status     (Consider location/radiation/quality/duration/timing/severity/associated sxs/prior Treatment) HPI Patient presents to the emergency department with altered mental status and lethargy.  The patient was found laying face down on the floor by his wife.  She states that he was sweating at the time and minimally responsive but would arouse and answer some questions.  EMS was called.  They found the patient to be arousable but would have to be sternally rubbed times to get his level of consciousness up.  The patient was given Narcan in route 2 without significant change in his mental status.  The wife states the patient has not had any recent illnesses.  She denies had any fever, nausea, vomiting, diarrhea, chest pain, shortness of breath, abdominal pain, weakness, dizziness, lethargy, or syncope.  He should states that he thinks that he just slid out of his chair, but is unclear about what happened.  The patient will arouse and answer some of my questions. Past Medical History  Diagnosis Date  . Spinal stenosis, thoracic   . Renal disorder   . Hypertension   . Cancer   . Back pain   . Diabetes mellitus without complication    Past Surgical History  Procedure Laterality Date  . Nephrectomy     History reviewed. No pertinent family history. History  Substance Use Topics  . Smoking status: Never Smoker   . Smokeless tobacco: Current User    Types: Snuff  . Alcohol Use: No    Review of Systems  Level 5 caveat applies due to altered mental status  Allergies  Review of patient's allergies indicates no known allergies.  Home Medications   Prior to Admission medications   Not on File   BP 108/65 mmHg  Pulse 70  Temp(Src) 98.6 F (37 C) (Oral)  Resp 26  Ht 5' 8.5" (1.74 m)  Wt 290 lb  (131.543 kg)  BMI 43.45 kg/m2  SpO2 100% Physical Exam  Constitutional: He appears well-developed and well-nourished. He appears distressed.  HENT:  Head: Normocephalic and atraumatic.  Mouth/Throat: Uvula is midline. Mucous membranes are dry.    Eyes: Pupils are equal, round, and reactive to light.  Neck: Normal range of motion. Neck supple.  Cardiovascular: Normal rate, regular rhythm and normal heart sounds.  Exam reveals no gallop and no friction rub.   No murmur heard. Pulmonary/Chest: Tachypnea noted. He is in respiratory distress. He has decreased breath sounds. He has no wheezes. He has no rhonchi. He has no rales.  Abdominal: Soft. Bowel sounds are normal. He exhibits no distension. There is no tenderness.  Neurological: He exhibits normal muscle tone.  The patient will arouse to verbal but is very lethargic and does not stay aroused for an extended period of time.  He does answer  questions appropriately  Skin: Skin is warm and dry.  Nursing note and vitals reviewed.   ED Course  INTUBATION Date/Time: 08/24/2014 4:39 PM Performed by: Dalia Heading Authorized by: Dalia Heading Consent: The procedure was performed in an emergent situation. Verbal consent not obtained. Written consent not obtained. Test results: test results available and properly labeled Imaging studies: imaging studies available Patient identity confirmed: provided demographic data and arm band Time out: Immediately prior to procedure a "time out" was called to verify the  correct patient, procedure, equipment, support staff and site/side marked as required. Indications: respiratory distress,  respiratory failure,  airway protection and  hypoxemia Intubation method: video-assisted Patient status: paralyzed (RSI) Preoxygenation: nonrebreather mask Tube size: 8.0 mm Tube type: cuffed Cricoid pressure: yes Cords visualized: yes Post-procedure assessment: chest rise and CO2 detector Breath  sounds: equal Cuff inflated: yes Tube secured with: ETT holder Chest x-Ebbie Cherry interpreted by radiologist. Chest x-Caran Storck findings: endotracheal tube in appropriate position Patient tolerance: Patient tolerated the procedure well with no immediate complications   (including critical care time) Labs Review Labs Reviewed  COMPREHENSIVE METABOLIC PANEL - Abnormal; Notable for the following:    CO2 20 (*)    Glucose, Bld 147 (*)    BUN 27 (*)    Creatinine, Ser 3.18 (*)    Calcium 8.8 (*)    AST 77 (*)    GFR calc non Af Amer 18 (*)    GFR calc Af Amer 21 (*)    All other components within normal limits  CBC WITH DIFFERENTIAL/PLATELET - Abnormal; Notable for the following:    WBC 15.8 (*)    Neutrophils Relative % 80 (*)    Neutro Abs 12.5 (*)    Monocytes Absolute 1.1 (*)    All other components within normal limits  URINALYSIS, ROUTINE W REFLEX MICROSCOPIC (NOT AT [facility]) - Abnormal; Notable for the following:    Color, Urine AMBER (*)    Glucose, UA 100 (*)    Hgb urine dipstick LARGE (*)    Protein, ur 100 (*)    All other components within normal limits  URINE RAPID DRUG SCREEN, HOSP PERFORMED - Abnormal; Notable for the following:    Opiates POSITIVE (*)    Benzodiazepines POSITIVE (*)    All other components within normal limits  CK - Abnormal; Notable for the following:    Total CK 7042 (*)    All other components within normal limits  URINE MICROSCOPIC-ADD ON - Abnormal; Notable for the following:    Casts HYALINE CASTS (*)    All other components within normal limits  TROPONIN I - Abnormal; Notable for the following:    Troponin I 0.39 (*)    All other components within normal limits  GLUCOSE, CAPILLARY - Abnormal; Notable for the following:    Glucose-Capillary 158 (*)    All other components within normal limits  CARBOXYHEMOGLOBIN - Abnormal; Notable for the following:    Methemoglobin 1.8 (*)    All other components within normal limits  URINALYSIS, ROUTINE W REFLEX  MICROSCOPIC (NOT AT [facility]) - Abnormal; Notable for the following:    Hgb urine dipstick LARGE (*)    Protein, ur 100 (*)    All other components within normal limits  URINE MICROSCOPIC-ADD ON - Abnormal; Notable for the following:    Casts HYALINE CASTS (*)    All other components within normal limits  GLUCOSE, CAPILLARY - Abnormal; Notable for the following:    Glucose-Capillary 162 (*)    All other components within normal limits  I-STAT CG4 LACTIC ACID, ED - Abnormal; Notable for the following:    Lactic Acid, Venous 6.62 (*)    All other components within normal limits  I-STAT TROPOININ, ED - Abnormal; Notable for the following:    Troponin i, poc 0.13 (*)    All other components within normal limits  I-STAT ARTERIAL BLOOD GAS, ED - Abnormal; Notable for the following:    pH, Arterial 7.196 (*)    pCO2  arterial 55.2 (*)    pO2, Arterial 73.0 (*)    Acid-base deficit 8.0 (*)    All other components within normal limits  I-STAT ARTERIAL BLOOD GAS, ED - Abnormal; Notable for the following:    pH, Arterial 7.195 (*)    pCO2 arterial 64.8 (*)    pO2, Arterial 261.0 (*)    Bicarbonate 24.9 (*)    Acid-base deficit 5.0 (*)    All other components within normal limits  CULTURE, BLOOD (ROUTINE X 2)  CULTURE, BLOOD (ROUTINE X 2)  URINE CULTURE  CULTURE, RESPIRATORY (NON-EXPECTORATED)  MRSA PCR SCREENING  LACTIC ACID, PLASMA  PROCALCITONIN  TROPONIN I  CALCIUM, IONIZED  TSH    Imaging Review Ct Head Wo Contrast  08/24/2014   CLINICAL DATA:  Slipped out of chair and onto the floor between 2 and 5 a.m. this morning, drowsy and week, constricted pupils, intubated, concern for drug overdose or injury related to fall  EXAM: CT HEAD WITHOUT CONTRAST  TECHNIQUE: Contiguous axial images were obtained from the base of the skull through the vertex without intravenous contrast.  COMPARISON:  06/11/2013  FINDINGS: Mild to moderate diffuse cortical atrophy, age-related. No hemorrhage or  extra-axial fluid. No mass, infarct, or hydrocephalus. Patient is intubated. Right maxillary sinus is opacified. There is mild inflammatory change in numerous ethmoid air cells. No evidence of skull fracture.  IMPRESSION: No acute findings. Negative except for age-related atrophy and sinus inflammation.   Electronically Signed   By: Skipper Cliche M.D.   On: 08/24/2014 13:44   Dg Chest Portable 1 View  08/24/2014   CLINICAL DATA:  Central catheter placement  EXAM: PORTABLE CHEST - 1 VIEW  COMPARISON:  Study obtained earlier in the day  FINDINGS: There is no central catheter with the tip along the right lateral wall of the superior vena cava. Endotracheal tube tip is 3.0 cm above the carina. Nasogastric tube tip and side port are in the stomach. No pneumothorax. There is patchy airspace consolidation in the right base with small right effusion. There is a minimal left effusion with slight atelectasis in the left base. Lungs are otherwise clear. Heart is slightly enlarged but stable.  IMPRESSION: Tube and catheter positions as described without pneumothorax. Patchy airspace opacity in the right base with small right effusion. Minimal left effusion with mild left base atelectasis. No change in cardiac silhouette.   Electronically Signed   By: Lowella Grip III M.D.   On: 08/24/2014 12:55   Dg Chest Portable 1 View  08/24/2014   CLINICAL DATA:  Hypoxia  EXAM: PORTABLE CHEST - 1 VIEW  COMPARISON:  August 24, 2014  FINDINGS: Endotracheal tube tip is 2.4 cm above the carina. Nasogastric tube tip and side port are below the diaphragm. No pneumothorax. There is atelectatic change in the right base. The lungs elsewhere clear. Heart is borderline enlarged with pulmonary vascularity within normal limits. No adenopathy. There is old trauma involving the lateral left clavicle, stable.  IMPRESSION: Tube positions as described without pneumothorax. Atelectatic change right base, slightly increased from earlier in the day.  Stable cardiac prominence.   Electronically Signed   By: Lowella Grip III M.D.   On: 08/24/2014 11:16   Dg Chest Portable 1 View  08/24/2014   CLINICAL DATA:  Patient slid out of chair, very short of breath, RIGHT hip pain, altered mental status  EXAM: PORTABLE CHEST - 1 VIEW  COMPARISON:  Portable exam 1011 hours compared to 11/04/2013  FINDINGS: Enlargement  of cardiac silhouette.  Atherosclerotic calcification aorta.  Mediastinal contours and pulmonary vascularity normal.  Mild elevation of RIGHT diaphragm with RIGHT basilar atelectasis.  Minimal central peribronchial thickening.  No definite infiltrate, pleural effusion or pneumothorax.  IMPRESSION: Enlargement of cardiac silhouette.  Bronchitic changes with RIGHT basilar atelectasis.   Electronically Signed   By: Lavonia Dana M.D.   On: 08/24/2014 10:34   Dg Hip Port Unilat With Pelvis 1v Right  08/24/2014   CLINICAL DATA:  Acute right hip pain after sliding out of a chair. Initial encounter.  EXAM: RIGHT HIP (WITH PELVIS) 1 VIEW PORTABLE  COMPARISON:  None.  FINDINGS: No acute fracture is identified. The right hip is located. Mild bilateral hip osteoarthrosis is noted. No lytic or blastic osseous lesion is identified.  IMPRESSION: No acute osseous abnormality identified.   Electronically Signed   By: Logan Bores   On: 08/24/2014 10:34     EKG Interpretation   Date/Time:  Wednesday August 24 2014 10:03:35 EDT Ventricular Rate:  84 PR Interval:  183 QRS Duration: 80 QT Interval:  363 QTC Calculation: 429 R Axis:   -53 Text Interpretation:  Sinus rhythm Left anterior fascicular block Abnormal  R-wave progression, early transition Nonspecific T abnrm, anterolateral  leads Confirmed by Kweku Stankey MD, Andee Poles ([id number]) on 08/24/2014 10:32:22 AM      CRITICAL CARE Performed by: Brent General Total critical care time: 55 minutes Critical care time was exclusive of separately billable procedures and treating other patients. Critical care was  necessary to treat or prevent imminent or life-threatening deterioration. Critical care was time spent personally by me on the following activities: development of treatment plan with patient and/or surrogate as well as nursing, discussions with consultants, evaluation of patient's response to treatment, examination of patient, obtaining history from patient or surrogate, ordering and performing treatments and interventions, ordering and review of laboratory studies, ordering and review of radiographic studies, pulse oximetry and re-evaluation of patient's condition.   Patient was intubated by me and Dr. Jeanell Sparrow due to decreasing respiratory drive and respiratory distress.  The patient is very ill with lactic acid being elevated white count, elevated hypotension without definite cause of sepsis.  Spoke with critical care medicine and they will be down to evaluate the patient  Dalia Heading, PA-C 08/24/14 1642  Pattricia Boss, MD 08/26/14 [phone]

## 2014-08-24 NOTE — ED Notes (Signed)
Called pharmacy for levophed- sts is working on it now.

## 2014-08-24 NOTE — ED Notes (Signed)
Attempted report 

## 2014-08-25 ENCOUNTER — Inpatient Hospital Stay (HOSPITAL_COMMUNITY): Payer: Medicare Other

## 2014-08-25 DIAGNOSIS — M6282 Rhabdomyolysis: Secondary | ICD-10-CM

## 2014-08-25 LAB — TROPONIN I
Troponin I: 0.73 ng/mL (ref <0.031)
Troponin I: 0.49 ng/mL — ABNORMAL HIGH (ref <0.031)

## 2014-08-25 LAB — BASIC METABOLIC PANEL
ANION GAP: 7 (ref 5–15)
ANION GAP: 8 (ref 5–15)
Anion gap: 7 (ref 5–15)
BUN: 29 mg/dL — ABNORMAL HIGH (ref 6–20)
BUN: 29 mg/dL — ABNORMAL HIGH (ref 6–20)
BUN: 31 mg/dL — AB (ref 6–20)
CALCIUM: 7.5 mg/dL — AB (ref 8.9–10.3)
CALCIUM: 7.6 mg/dL — AB (ref 8.9–10.3)
CO2: 22 mmol/L (ref 22–32)
CO2: 22 mmol/L (ref 22–32)
CO2: 23 mmol/L (ref 22–32)
CREATININE: 3.01 mg/dL — AB (ref 0.61–1.24)
Calcium: 7.5 mg/dL — ABNORMAL LOW (ref 8.9–10.3)
Chloride: 109 mmol/L (ref 101–111)
Chloride: 110 mmol/L (ref 101–111)
Chloride: 110 mmol/L (ref 101–111)
Creatinine, Ser: 2.8 mg/dL — ABNORMAL HIGH (ref 0.61–1.24)
Creatinine, Ser: 3.11 mg/dL — ABNORMAL HIGH (ref 0.61–1.24)
GFR calc Af Amer: 24 mL/min — ABNORMAL LOW (ref >60)
GFR calc non Af Amer: 19 mL/min — ABNORMAL LOW (ref >60)
GFR calc non Af Amer: 19 mL/min — ABNORMAL LOW (ref >60)
GFR calc non Af Amer: 21 mL/min — ABNORMAL LOW (ref >60)
GFR, EST AFRICAN AMERICAN: 22 mL/min — AB (ref >60)
GFR, EST AFRICAN AMERICAN: 21 mL/min — AB (ref >60)
GLUCOSE: 147 mg/dL — AB (ref 65–99)
Glucose, Bld: 135 mg/dL — ABNORMAL HIGH (ref 65–99)
Glucose, Bld: 221 mg/dL — ABNORMAL HIGH (ref 65–99)
POTASSIUM: 4.4 mmol/L (ref 3.5–5.1)
Potassium: 3.9 mmol/L (ref 3.5–5.1)
Potassium: 4.2 mmol/L (ref 3.5–5.1)
SODIUM: 139 mmol/L (ref 135–145)
Sodium: 140 mmol/L (ref 135–145)
Sodium: 139 mmol/L (ref 135–145)

## 2014-08-25 LAB — MAGNESIUM
MAGNESIUM: 2.3 mg/dL (ref 1.7–2.4)
MAGNESIUM: 2.3 mg/dL (ref 1.7–2.4)

## 2014-08-25 LAB — CBC
HCT: 43.2 % (ref 39.0–52.0)
Hemoglobin: 13.7 g/dL (ref 13.0–17.0)
MCH: 28 pg (ref 26.0–34.0)
MCHC: 31.7 g/dL (ref 30.0–36.0)
MCV: 88.3 fL (ref 78.0–100.0)
PLATELETS: 202 10*3/uL (ref 150–400)
RBC: 4.89 MIL/uL (ref 4.22–5.81)
RDW: 14.6 % (ref 11.5–15.5)
WBC: 11.2 10*3/uL — AB (ref 4.0–10.5)

## 2014-08-25 LAB — GLUCOSE, CAPILLARY
GLUCOSE-CAPILLARY: 128 mg/dL — AB (ref 65–99)
GLUCOSE-CAPILLARY: 128 mg/dL — AB (ref 65–99)
Glucose-Capillary: 132 mg/dL — ABNORMAL HIGH (ref 65–99)
Glucose-Capillary: 118 mg/dL — ABNORMAL HIGH (ref 65–99)
Glucose-Capillary: 103 mg/dL — ABNORMAL HIGH (ref 65–99)

## 2014-08-25 LAB — URINE CULTURE: CULTURE: NO GROWTH

## 2014-08-25 LAB — CALCIUM, IONIZED: CALCIUM, IONIZED, SERUM: 4.3 mg/dL — AB (ref 4.5–5.6)

## 2014-08-25 LAB — PHOSPHORUS: Phosphorus: 5.1 mg/dL — ABNORMAL HIGH (ref 2.5–4.6)

## 2014-08-25 LAB — CK: Total CK: 30675 U/L — ABNORMAL HIGH (ref 49–397)

## 2014-08-25 MED ORDER — CETYLPYRIDINIUM CHLORIDE 0.05 % MT LIQD
7.0000 mL | Freq: Two times a day (BID) | OROMUCOSAL | Status: DC
  Administered 2014-08-25 – 2014-08-26 (×3): 7 mL via OROMUCOSAL

## 2014-08-25 MED ORDER — SODIUM CHLORIDE 0.45 % IV SOLN
INTRAVENOUS | Status: DC
  Administered 2014-08-25: 19:00:00 via INTRAVENOUS
  Administered 2014-08-25: 75 mL/h via INTRAVENOUS
  Administered 2014-08-26 (×2): via INTRAVENOUS

## 2014-08-25 MED ORDER — FENTANYL CITRATE (PF) 100 MCG/2ML IJ SOLN: 12.5000 ug | INTRAMUSCULAR | Status: DC | PRN

## 2014-08-25 MED ORDER — PANTOPRAZOLE SODIUM 40 MG PO TBEC
40.0000 mg | DELAYED_RELEASE_TABLET | Freq: Every day | ORAL | Status: DC
  Administered 2014-08-26: 40 mg via ORAL
  Filled 2014-08-25 (×2): qty 1

## 2014-08-25 NOTE — Care Management Note (Signed)
Case Management Note  Patient Details  Name: John Tucker MRN: [id number] Date of Birth: 1940-09-29  Subjective/Objective:    Patient extubated this am.  On [place], sitting in bed, awake and alert.  States lives with wife, neither are independent.  Have daughter. He uses a motorized w/c to get along but is able to ambulate around house and to restroom.                  Action/Plan:   Expected Discharge Date:                  Expected Discharge Plan:  [place]  In-House Referral:     Discharge planning Services     Post Acute Care Choice:    Choice offered to:     DME Arranged:    DME Agency:     HH Arranged:    [place] Agency:     Status of Service:  In process, will continue to follow  Medicare Important Message Given:    Date Medicare IM Given:    Medicare IM give by:    Date Additional Medicare IM Given:    Additional Medicare Important Message give by:     If discussed at [place] of Stay Meetings, dates discussed:    Additional Comments:  Vergie Living, RN 08/25/2014, 12:00 PM

## 2014-08-25 NOTE — Progress Notes (Signed)
125 fentanyl wasted in sink, witnessed by Shea Stakes B

## 2014-08-25 NOTE — Progress Notes (Signed)
PULMONARY / CRITICAL CARE MEDICINE   Name: John Tucker MRN: [id number] DOB: 14-Nov-1940    ADMISSION DATE:  08/24/2014 CONSULTATION DATE:  08/25/2014  REFERRING MD :  EDP  CHIEF COMPLAINT:  AMS  INITIAL PRESENTATION:  74 y.o. M brought to [facility] ED 08/24/14 with AMS and hypoxia.  In ED, he required intubation and later had hypotension that was refractory to IVF resuscitation.  PCCM called for admission.   STUDIES:  CXR 7/6 >>> small right effusion / atx. CT head 7/6 >>> no acute process.  SIGNIFICANT EVENTS: 7/6 - admitted and intubated 7/7 - extubated  PAST MEDICAL HISTORY :   has a past medical history of Spinal stenosis, thoracic; Renal disorder; Hypertension; Cancer; Back pain; and Diabetes mellitus without complication.  has past surgical history that includes Nephrectomy.      Allergies  Allergen Reactions  . Iodinated Diagnostic Agents Hives and Itching    OK with benadryl.    FAMILY HISTORY:  History reviewed. No pertinent family history.  SOCIAL HISTORY:  reports that he has never smoked. His smokeless tobacco use includes Snuff. He reports that he does not drink alcohol or use illicit drugs.  SUBJECTIVE:   VITAL SIGNS: Temp:  [98.5 F (36.9 C)-99.8 F (37.7 C)] 98.5 F (36.9 C) (07/07 1231) Pulse Rate:  [68-93] 87 (07/07 1200) Resp:  [14-26] 16 (07/07 1200) BP: (95-128)/(44-97) 110/62 mmHg (07/07 1200) SpO2:  [92 %-100 %] 93 % (07/07 1200) FiO2 (%):  [40 %] 40 % (07/07 1000) Weight:  [308 lb 10.3 oz (140 kg)] 308 lb 10.3 oz (140 kg) (07/07 0500) HEMODYNAMICS: CVP:  [8 mmHg-11 mmHg] 11 mmHg VENTILATOR SETTINGS: Vent Mode:  [-] PSV;CPAP FiO2 (%):  [40 %] 40 % Set Rate:  [16 bmp-26 bmp] 16 bmp Vt Set:  [500 mL] 500 mL PEEP:  [5 cmH20] 5 cmH20 Pressure Support:  [5 cmH20] 5 cmH20 Plateau Pressure:  [15 cmH20-21 cmH20] 15 cmH20 INTAKE / OUTPUT: Intake/Output      07/06 0701 - 07/07 0700 07/07 0701 - 07/08 0700   I.V. (mL/kg) 2436.2 (17.4) 200 (1.4)   IV  Piggyback 150    Total Intake(mL/kg) 2586.2 (18.5) 200 (1.4)   Urine (mL/kg/hr) 1775 610 (0.6)   Emesis/NG output  100 (0.1)   Total Output 1775 710   Net +811.2 -510          PHYSICAL EXAMINATION: General: Adult male, in NAD. Neuro: awake, interactive; answers questions appropriately HEENT: [place]/AT. PERRL, sclerae anicteric. Dentition poor; IJ line in place Cardiovascular: RRR, no M/R/G.  Lungs: Respirations even and unlabored. CTA bilaterally  Abdomen: Obese, soft, NT/ND, +BS Musculoskeletal: No gross deformities, +1 pittingedema Skin: Intact, warm, no rashes.  LABS:  CBC  Recent Labs Lab 08/24/14 1000 08/25/14 0345  WBC 15.8* 11.2*  HGB 15.5 13.7  HCT 48.1 43.2  PLT 200 202   Coag's No results for input(s): APTT, INR in the last 168 hours. BMET  Recent Labs Lab 08/24/14 1000 08/25/14 08/25/14 0345  NA 138 139 140  K 4.6 4.2 4.4  CL 105 109 110  CO2 20* 22 23  BUN 27* 29* 31*  CREATININE 3.18* 2.80* 3.01*  GLUCOSE 147* 135* 147*   Electrolytes  Recent Labs Lab 08/24/14 1000 08/25/14 08/25/14 0345  CALCIUM 8.8* 7.5* 7.5*  MG  --  2.3 2.3  PHOS  --   --  5.1*   Sepsis Markers  Recent Labs Lab 08/24/14 1019 08/24/14 1429 08/24/14 1514  LATICACIDVEN 6.62* 1.4  --  PROCALCITON  --   --  0.14   ABG  Recent Labs Lab 08/24/14 1008 08/24/14 1133  PHART 7.196* 7.195*  PCO2ART 55.2* 64.8*  PO2ART 73.0* 261.0*   Liver Enzymes  Recent Labs Lab 08/24/14 1000  AST 77*  ALT 26  ALKPHOS 63  BILITOT 0.6  ALBUMIN 3.6   Cardiac Enzymes  Recent Labs Lab 08/24/14 1820 08/25/14 08/25/14 0800  TROPONINI 0.74* 0.73* 0.49*   Glucose  Recent Labs Lab 08/24/14 1548 08/24/14 2003 08/24/14 2346 08/25/14 0339 08/25/14 0746 08/25/14 1223  GLUCAP 162* 135* 128* 128* 132* 118*    Imaging Dg Chest Port 1 View  08/25/2014   CLINICAL DATA:  Respiratory failure.  Shortness of breath.  EXAM: PORTABLE CHEST - 1 VIEW  COMPARISON:  08/24/2014.   FINDINGS: Endotracheal tube, left IJ line, NG tube in stable position. Heart size stable. Left lower lobe infiltrate noted consistent with pneumonia. Small left pleural effusion. Right base subsegmental atelectasis. No pneumothorax.  IMPRESSION: 1. Lines and tubes in stable position. 2. Left lower lobe infiltrate consistent with pneumonia. Small left pleural effusion. 3. Right base subsegmental atelectasis.   Electronically Signed   By: Marcello Moores  Register   On: 08/25/2014 07:42    ASSESSMENT / PLAN:  PULMONARY OETT 7/6 >>>7/7 A: Acute hypoxic and hypercarbic respiratory failure Respiratory acidosis. Atelectasis. P:   S/p intubation/extubation abx as below  CARDIOVASCULAR CVL L IJ 7/6 >>> A:  Hypotension - ? Secondary to sedation post intubation vs shock.  Troponin leak - suspect demand ischemia. Hx HTN. 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 Rhabdomyolysis: CK 30,675 Pseudohypocalcemia - corrects to 9.1. Hx RCC s/p left nephrectomy 2015. P:   1/2NS @ 110ml/hr; consider increasing if tol Repeat CK in AM. BMP later today and in AM  GASTROINTESTINAL A:   GI prophylaxis. Nutrition. P:   SUP: Pantoprazole. NPO.  HEMATOLOGIC / ONCOLOGIC A:   Hx RCC s/p left nephrectomy 2015. VTE Prophylaxis. P:  SCD's / Heparin. CBC in AM.  INFECTIOUS A:   Concern for septic shock of unclear etiology - doubtful but will consider for now. P:   BCx2 7/6 > UCx 7/6 > Sputum Cx 7/6 > Abx: Vanc 7/6 >> 7/7 Abx: Zosyn 7/6 >> Procalcitonin wnl; repeat in AM; if low, DC Zosyn  ENDOCRINE A:   DM.   P:   SSI. TSH wnl  NEUROLOGIC A:   Acute metabolic encephalopathy. Hx chronic back pain, spinal stenosis - on chronic opiates and benzo's. UDS positive for opiates and benzo's (has Rx for these). P:   Fentanyl PRN  RASS goal: 0   Family updated: Wife and daughters updated  7/6  Interdisciplinary Family Meeting v Palliative Care Meeting:  Due by: 08/30/14.   Elberta Leatherwood, MD,MS,  PGY2 08/25/2014 2:54 PM  PCCM ATTENDING: I have reviewed pt's initial presentation, consultants notes and hospital database in detail.  The above assessment and plan was formulated under my direction.  In summary: Intubated 7/06 for AMS Initial dx included possible PNA Passed SBT this AM and successfully extubated I am not compelled that there is PNA Severe rhabdomyolysis (elevated CK)  Uo good, Cr stable This appears to be hypercarbic failure due to injudicious use of sedating/analgesic medications  His wife raised concern about overuse of alprazolam and oxycodone DC vanc 7/07 F/U PCT 7/08 and consider DC pip-tazo   40 minutes of independent CCM  time was provided by me   Merton Border, MD;  PCCM service; Mobile [phone]

## 2014-08-25 NOTE — Procedures (Signed)
Extubation Procedure Note  Patient Details:   Name: Ankush Gintz DOB: 1940-11-23 MRN: [id number]   Airway Documentation:     Evaluation  O2 sats: stable throughout Complications: No apparent complications Patient did tolerate procedure well. Bilateral Breath Sounds: Clear, Diminished Suctioning: Oral, Airway Yes  Lamonte Sakai 08/25/2014, 10:27 AM

## 2014-08-25 NOTE — Clinical Social Work Note (Signed)
CSW consult acknowledged:  Clinical Social Worker received consult for current substance abuse. CSW to meet with patient to complete psychosocial assessment.   Glendon Axe, MSW, LCSWA [phone] 08/25/2014 11:33 AM

## 2014-08-26 ENCOUNTER — Inpatient Hospital Stay (HOSPITAL_COMMUNITY): Payer: Medicare Other

## 2014-08-26 LAB — BASIC METABOLIC PANEL
Anion gap: 7 (ref 5–15)
BUN: 26 mg/dL — AB (ref 6–20)
CO2: 23 mmol/L (ref 22–32)
CREATININE: 3.03 mg/dL — AB (ref 0.61–1.24)
Calcium: 7.6 mg/dL — ABNORMAL LOW (ref 8.9–10.3)
Chloride: 105 mmol/L (ref 101–111)
GFR calc Af Amer: 22 mL/min — ABNORMAL LOW (ref >60)
GFR calc non Af Amer: 19 mL/min — ABNORMAL LOW (ref >60)
Glucose, Bld: 130 mg/dL — ABNORMAL HIGH (ref 65–99)
Potassium: 3.7 mmol/L (ref 3.5–5.1)
Sodium: 135 mmol/L (ref 135–145)

## 2014-08-26 LAB — GLUCOSE, CAPILLARY
GLUCOSE-CAPILLARY: 102 mg/dL — AB (ref 65–99)
GLUCOSE-CAPILLARY: 131 mg/dL — AB (ref 65–99)
GLUCOSE-CAPILLARY: 93 mg/dL (ref 65–99)
Glucose-Capillary: 160 mg/dL — ABNORMAL HIGH (ref 65–99)
Glucose-Capillary: 123 mg/dL — ABNORMAL HIGH (ref 65–99)
Glucose-Capillary: 129 mg/dL — ABNORMAL HIGH (ref 65–99)
Glucose-Capillary: 101 mg/dL — ABNORMAL HIGH (ref 65–99)

## 2014-08-26 LAB — CBC
HEMATOCRIT: 39.5 % (ref 39.0–52.0)
HEMOGLOBIN: 12.5 g/dL — AB (ref 13.0–17.0)
MCH: 27.4 pg (ref 26.0–34.0)
MCHC: 31.6 g/dL (ref 30.0–36.0)
MCV: 86.6 fL (ref 78.0–100.0)
Platelets: 158 10*3/uL (ref 150–400)
RBC: 4.56 MIL/uL (ref 4.22–5.81)
RDW: 14.5 % (ref 11.5–15.5)
WBC: 7.7 10*3/uL (ref 4.0–10.5)

## 2014-08-26 LAB — MRSA CULTURE

## 2014-08-26 LAB — CK
CK TOTAL: 9158 U/L — AB (ref 49–397)
Total CK: 9246 U/L — ABNORMAL HIGH (ref 49–397)

## 2014-08-26 LAB — PROCALCITONIN: PROCALCITONIN: 0.19 ng/mL

## 2014-08-26 MED ORDER — PREGABALIN 75 MG PO CAPS
75.0000 mg | ORAL_CAPSULE | Freq: Two times a day (BID) | ORAL | Status: DC
  Administered 2014-08-26 (×2): 75 mg via ORAL
  Filled 2014-08-26 (×3): qty 1

## 2014-08-26 MED ORDER — INSULIN ASPART 100 UNIT/ML ~~LOC~~ SOLN
3.0000 [IU] | Freq: Three times a day (TID) | SUBCUTANEOUS | Status: DC
  Administered 2014-08-26 (×2): 3 [IU] via SUBCUTANEOUS

## 2014-08-26 MED ORDER — TAMSULOSIN HCL 0.4 MG PO CAPS
0.4000 mg | ORAL_CAPSULE | Freq: Every day | ORAL | Status: DC
  Administered 2014-08-26: 0.4 mg via ORAL
  Filled 2014-08-26 (×2): qty 1

## 2014-08-26 MED ORDER — INSULIN GLARGINE 100 UNIT/ML ~~LOC~~ SOLN
15.0000 [IU] | Freq: Every day | SUBCUTANEOUS | Status: DC
  Administered 2014-08-26: 15 [IU] via SUBCUTANEOUS
  Filled 2014-08-26 (×2): qty 0.15

## 2014-08-26 MED ORDER — METOPROLOL SUCCINATE ER 50 MG PO TB24
50.0000 mg | ORAL_TABLET | Freq: Every day | ORAL | Status: DC
  Administered 2014-08-26: 50 mg via ORAL
  Filled 2014-08-26 (×3): qty 1

## 2014-08-26 MED ORDER — INSULIN ASPART 100 UNIT/ML ~~LOC~~ SOLN: 0.0000 [IU] | Freq: Every day | SUBCUTANEOUS | Status: DC

## 2014-08-26 MED ORDER — ASPIRIN 325 MG PO TABS
325.0000 mg | ORAL_TABLET | Freq: Every day | ORAL | Status: DC
  Administered 2014-08-26: 325 mg via ORAL
  Filled 2014-08-26 (×2): qty 1

## 2014-08-26 MED ORDER — INSULIN ASPART 100 UNIT/ML ~~LOC~~ SOLN
0.0000 [IU] | Freq: Three times a day (TID) | SUBCUTANEOUS | Status: DC
  Administered 2014-08-26 (×2): 2 [IU] via SUBCUTANEOUS

## 2014-08-26 NOTE — Evaluation (Signed)
Physical Therapy Evaluation Patient Details Name: John Tucker MRN: [id number] DOB: 1940/04/11 Today's Date: 08/26/2014   History of Present Illness  Pt is a 74 y/o male who presents to the [facility] 7/6 with AMS and hypoxia. In ED pt required intubation and later had hypotension that was refractory to IVF resuscitation. Pt extubated 7/7.   Clinical Impression  Pt admitted with above diagnosis. Pt currently with functional limitations due to the deficits listed below (see PT Problem List). At the time of PT eval pt was able to perform transfers and ambulation with mod I to supervision. Decreased safety awareness grossly with regards to lines and requires direct cueing for safety. Pt likely close to baseline and states he anticipates being back to baseline tomorrow. Pt will benefit from skilled PT to increase their independence and safety with mobility to allow discharge to the venue listed below.       Follow Up Recommendations Other (comment) (Pt declining HHPT services)    Equipment Recommendations  None recommended by PT    Recommendations for Other Services       Precautions / Restrictions Precautions Precautions: Fall Restrictions Weight Bearing Restrictions: No      Mobility  Bed Mobility               General bed mobility comments: Pt sitting up in the recliner when PT arrived.  Transfers Overall transfer level: Modified independent Equipment used: None Transfers: Sit to/from Stand           General transfer comment: No physical assist and no unsteadiness/LOB noted.   Ambulation/Gait Ambulation/Gait assistance: Supervision Ambulation Distance (Feet): 125 Feet Assistive device: Rolling walker (2 wheeled) Gait Pattern/deviations: Step-through pattern;Decreased stride length;Wide base of support Gait velocity: Decreased Gait velocity interpretation: Below normal speed for age/gender General Gait Details: Pt states he needs something to hold on to while  ambulating. Did well with the walker, however required VC's for walker placement and general safety awareness. Was not very aware of lines and required multiple cues to decrease risk of inury.  Stairs            Wheelchair Mobility    Modified Rankin (Stroke Patients Only)       Balance Overall balance assessment: Needs assistance Sitting-balance support: Feet supported;No upper extremity supported Sitting balance-Leahy Scale: Good     Standing balance support: Bilateral upper extremity supported;During functional activity Standing balance-Leahy Scale: Poor Standing balance comment: Requires UE support for dynamic standing activity.                              Pertinent Vitals/Pain Pain Assessment: No/denies pain    Home Living Family/patient expects to be discharged to:: Private residence Living Arrangements: Spouse/significant other Available Help at Discharge: Family;Available 24 hours/day Type of Home: Apartment Home Access: Level entry     Home Layout: One level Home Equipment: Electric scooter;Cane - single point;Shower seat - built in      Prior Function Level of Independence: Independent with assistive device(s)         Comments: Could walk ~200 feet at a time before needing power chair.     Hand Dominance   Dominant Hand: Right    Extremity/Trunk Assessment   Upper Extremity Assessment: Defer to OT evaluation           Lower Extremity Assessment: Overall WFL for tasks assessed (Gross strength 4+/5 bilaterally)      Cervical / Trunk Assessment:  Other exceptions  Communication   Communication: No difficulties  Cognition Arousal/Alertness: Awake/alert Behavior During Therapy: WFL for tasks assessed/performed Overall Cognitive Status: Within Functional Limits for tasks assessed                      General Comments      Exercises        Assessment/Plan    PT Assessment Patient needs continued PT services   PT Diagnosis Difficulty walking   PT Problem List Decreased strength;Decreased activity tolerance;Decreased range of motion;Decreased balance;Decreased mobility;Decreased knowledge of use of DME;Decreased safety awareness;Decreased knowledge of precautions;Cardiopulmonary status limiting activity  PT Treatment Interventions DME instruction;Gait training;Stair training;Functional mobility training;Therapeutic activities;Therapeutic exercise;Neuromuscular re-education;Patient/family education   PT Goals (Current goals can be found in the Care Plan section) Acute Rehab PT Goals Patient Stated Goal: Home today PT Goal Formulation: With patient Time For Goal Achievement: 09/02/14 Potential to Achieve Goals: Good    Frequency Min 3X/week   Barriers to discharge        Co-evaluation               End of Session Equipment Utilized During Treatment:  (Pt declined gait belt) Activity Tolerance: Patient tolerated treatment well Patient left: in chair;with call bell/phone within reach Nurse Communication: Mobility status         Time: 6378-5885 PT Time Calculation (min) (ACUTE ONLY): 26 min   Charges:   PT Evaluation $Initial PT Evaluation Tier I: 1 Procedure PT Treatments $Gait Training: 8-22 mins   PT G Codes:        Rolinda Roan 2014-09-01, 10:16 AM   Rolinda Roan, PT, DPT Acute Rehabilitation Services Pager: [phone]
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
## 2014-08-27 DIAGNOSIS — J9601 Acute respiratory failure with hypoxia: Secondary | ICD-10-CM

## 2014-08-27 LAB — BASIC METABOLIC PANEL
Anion gap: 6 (ref 5–15)
BUN: 26 mg/dL — ABNORMAL HIGH (ref 6–20)
CO2: 25 mmol/L (ref 22–32)
Calcium: 8.2 mg/dL — ABNORMAL LOW (ref 8.9–10.3)
Chloride: 109 mmol/L (ref 101–111)
Creatinine, Ser: 3.17 mg/dL — ABNORMAL HIGH (ref 0.61–1.24)
GFR calc Af Amer: 21 mL/min — ABNORMAL LOW (ref >60)
GFR, EST NON AFRICAN AMERICAN: 18 mL/min — AB (ref >60)
GLUCOSE: 126 mg/dL — AB (ref 65–99)
Potassium: 4.7 mmol/L (ref 3.5–5.1)
SODIUM: 140 mmol/L (ref 135–145)

## 2014-08-27 LAB — CULTURE, RESPIRATORY W GRAM STAIN

## 2014-08-27 LAB — GLUCOSE, CAPILLARY: Glucose-Capillary: 109 mg/dL — ABNORMAL HIGH (ref 65–99)

## 2014-08-27 LAB — CBC
HEMATOCRIT: 40.4 % (ref 39.0–52.0)
Hemoglobin: 13 g/dL (ref 13.0–17.0)
MCH: 27.6 pg (ref 26.0–34.0)
MCHC: 32.2 g/dL (ref 30.0–36.0)
MCV: 85.8 fL (ref 78.0–100.0)
Platelets: 190 10*3/uL (ref 150–400)
RBC: 4.71 MIL/uL (ref 4.22–5.81)
RDW: 14.2 % (ref 11.5–15.5)
WBC: 9.3 10*3/uL (ref 4.0–10.5)

## 2014-08-27 LAB — CULTURE, RESPIRATORY

## 2014-08-27 NOTE — Significant Event (Signed)
Dr Coralyn Pear called informed patient wants to go AMA . Doctor states will be up to see patient.

## 2014-08-27 NOTE — Discharge Summary (Signed)
Physician Discharge Summary  John Tucker WJX:[id number] DOB: 05-15-1940 DOA: 08/24/2014  PCP: No primary care provider on file.  Admit date: 08/24/2014 Discharge date: 08/27/2014  Time spent: 20 minutes  Recommendations for Outpatient Follow-up:  1. PATIENT LEFT AGAINST MEDICAL ADVICE 2. Please follow up on BMP and CPK. On day he left AMA his creatinine was 3.17 (3.03 day prior) along with CPK of 9,246. I had an extensive discussion with him regarding my concerns over his kidney function and rhabdomyalisis. He told me his baseline creatinine was close to 2.3 and I recommended multiple times he stay to receive IV fluid hydration and repeat labs in am to ensure kidney function was improving. Despite my best attempts he proceeded signing himself out AMA, telling me he would drink plenty of fluids at home and follow up with his PCP early this week.  3. I discussed my concerns over narcotic overuse. He stated not having any more narcotic analgesics at home and would follow up with his PCP this week. He was not given scripts for narcotic analgesics.  4. He was instructed to return to the hospital immediately if he felt worse.   Discharge Diagnoses:  Active Problems:   Acute respiratory failure with hypoxia and hypercarbia   Discharge Condition: Left AMA  Diet recommendation: Heart Healthy  Filed Weights   08/26/14 0500 08/26/14 1233 08/27/14 0444  Weight: 143.7 kg (316 lb 12.8 oz) 138.891 kg (306 lb 3.2 oz) 137.531 kg (303 lb 3.2 oz)    History of present illness:  Pt is encephalopathic; therefore, this HPI is obtained from chart review. John Tucker is a 74 y.o. M with PMH of spinal stenosis, chronic back pain, HTN, Renal Cell Carcinoma s/p left nephrectomy 2015, and DM. He was brought to [facility] ED 08/23/13 after his wife found him on the bedroom floor around 7AM that morning. She stated that he was lying on the floor and was diaphoretic. He was drowsy but aroused to voice. She called EMS due to  not being able to get him up. On EMS arrival, pt was very drowsy with pinpoint pupils. He was apparently given narcan x 2 without response. On ED arrival, pt was hypoxic to SpO2 88%. Pt was able to open his eyes to speech and was A&O x 4 initially; however, later his mental status declined and pt was unable to answer questions appropriately and had very shallow respirations. ABG revealed respiratory acidosis and pt was subsequently intubated for airway protection. BP prior to intubation was borderline with SBP of 105 and following intubation, it dropped into SBP's of 70 - 80's. Initial lactate was 6; therefore, code sepsis was activated and pt was given 4L IVF without much change in BP.  Wife states that pt had been in his USOH prior to events noted above. With the exception of a cough with occasional sputum production and 1 episode of diarrhea, pt had been fully asymptomatic. She did not know of any fevers/chills/sweats, chest pain, SOB, N/V/abd pain, dysuria, myalgias.  Note, urine in foley collection bag has pink colored tinge to it. Wife reports that pt did not mention any discoloration to urine over past few days. She is unsure how long he was down for this morning, possibly 4 - 5 hours.  Hospital Course:  Patient is a 74 year old gentleman with a past medical history spinal stenosis, chronic lower back pain, renal cell carcinoma status post left nephrectomy who was admitted to the pulmonary critical care medicine service on 08/24/2014. He presented  encephalopathic and hypoxemic undergoing rapid sequence intubation. He was also hypotensive for which he underwent IV fluid resuscitation. It was suspected that respiratory failure/hypotension was likely related to narcotic analgesia, overdose. Labs revealed the presence of acute on chronic renal failure along with rhabdomyolysis, presenting with a CPK of 30,675. He was successfully extubated on 08/25/2014. He had admitted to taking "more than I  should" for his back pain. He was transferred to the floor on 08/26/2014. On 08/27/2014 his CPK came down to 9158 however kidney function did not improve. His creatinine went up from 2.8 on 08/25/2014 to 3.17 on 08/27/2014. On 08/27/2014 he expressed wishes to be discharged. I told him my concerns over his kidney function and elevated CPK. I thought it would be prudent keep him in the hospital, treat him with IV fluid resuscitation, and repeat labs in morning to ensure stability/improvement. Unfortunately John Tucker did not agree with these recommendations and signed AMA paperwork. He was explained that his condition could worsen and recommended he come back to the hospital immediately if he felt worse. I also recommended he not take anymore narcotic analgesics, manage his pain with tylenol and to call his PCP on Monday morning.   Procedures: 7/6 - admitted and intubated 7/7 - extubated  Consultations:  PCCM  Discharge Exam: Filed Vitals:   08/27/14 0444  BP: 129/69  Pulse: 79  Temp: 98.1 F (36.7 C)  Resp: 18    General: Patient signed out [facility]   Discharge Instructions    Current Discharge Medication List    CONTINUE these medications which have NOT CHANGED   Details  ALPRAZolam (XANAX) 1 MG tablet Take 0.5 mg by mouth 2 (two) times daily.  Refills: 3    aspirin 325 MG tablet Take 325 mg by mouth daily.    benazepril (LOTENSIN) 20 MG tablet Take 10-20 mg by mouth daily. Cut in half as needed for BP, can take another half if BP is still high    CRESTOR 10 MG tablet Take 10 mg by mouth daily. Refills: 3    esomeprazole (NEXIUM) 40 MG capsule Take 40 mg by mouth daily as needed (acid reflux).  Refills: 4    glimepiride (AMARYL) 4 MG tablet Take 4 mg by mouth daily. Refills: 3    LANTUS SOLOSTAR 100 UNIT/ML Solostar Pen Inject 32 Units into the skin daily at 10 pm.  Refills: 2    LYRICA 75 MG capsule Take 75 mg by mouth daily.  Refills: 2    metoprolol succinate  (TOPROL-XL) 100 MG 24 hr tablet Take 100 mg by mouth daily. Refills: 2    Oxycodone HCl 10 MG TABS Take 10 mg by mouth every 8 (eight) hours as needed (for pain).  Refills: 0    tamsulosin (FLOMAX) 0.4 MG CAPS capsule Take 0.4 mg by mouth daily. Refills: 3       Allergies  Allergen Reactions  . Iodinated Diagnostic Agents Hives and Itching    OK with benadryl.      The results of significant diagnostics from this hospitalization (including imaging, microbiology, ancillary and laboratory) are listed below for reference.    Significant Diagnostic Studies: Ct Head Wo Contrast  08/24/2014   CLINICAL DATA:  Slipped out of chair and onto the floor between 2 and 5 a.m. this morning, drowsy and week, constricted pupils, intubated, concern for drug overdose or injury related to fall  EXAM: CT HEAD WITHOUT CONTRAST  TECHNIQUE: Contiguous axial images were obtained from the base of  the skull through the vertex without intravenous contrast.  COMPARISON:  06/11/2013  FINDINGS: Mild to moderate diffuse cortical atrophy, age-related. No hemorrhage or extra-axial fluid. No mass, infarct, or hydrocephalus. Patient is intubated. Right maxillary sinus is opacified. There is mild inflammatory change in numerous ethmoid air cells. No evidence of skull fracture.  IMPRESSION: No acute findings. Negative except for age-related atrophy and sinus inflammation.   Electronically Signed   By: Skipper Cliche M.D.   On: 08/24/2014 13:44   Dg Chest Port 1 View  08/26/2014   CLINICAL DATA:  Altered mental status and hypoxia resulting in intubation. Hypotension.  EXAM: PORTABLE CHEST - 1 VIEW  COMPARISON:  Single view of the chest 08/25/2014 and 08/24/2014.  FINDINGS: The patient's endotracheal tube and NG tube have been removed. Left IJ catheter is unchanged. Mild right basilar atelectasis is identified. Left basilar atelectasis has improved. No pneumothorax or pleural effusion. Heart size is mildly enlarged.  IMPRESSION:  Status post extubation and NG tube removal.  Improved left basilar atelectasis. Subsegmental atelectasis right base noted.   Electronically Signed   By: Inge Rise M.D.   On: 08/26/2014 07:14   Dg Chest Port 1 View  08/25/2014   CLINICAL DATA:  Respiratory failure.  Shortness of breath.  EXAM: PORTABLE CHEST - 1 VIEW  COMPARISON:  08/24/2014.  FINDINGS: Endotracheal tube, left IJ line, NG tube in stable position. Heart size stable. Left lower lobe infiltrate noted consistent with pneumonia. Small left pleural effusion. Right base subsegmental atelectasis. No pneumothorax.  IMPRESSION: 1. Lines and tubes in stable position. 2. Left lower lobe infiltrate consistent with pneumonia. Small left pleural effusion. 3. Right base subsegmental atelectasis.   Electronically Signed   By: Marcello Moores  Register   On: 08/25/2014 07:42   Dg Chest Portable 1 View  08/24/2014   CLINICAL DATA:  Central catheter placement  EXAM: PORTABLE CHEST - 1 VIEW  COMPARISON:  Study obtained earlier in the day  FINDINGS: There is no central catheter with the tip along the right lateral wall of the superior vena cava. Endotracheal tube tip is 3.0 cm above the carina. Nasogastric tube tip and side port are in the stomach. No pneumothorax. There is patchy airspace consolidation in the right base with small right effusion. There is a minimal left effusion with slight atelectasis in the left base. Lungs are otherwise clear. Heart is slightly enlarged but stable.  IMPRESSION: Tube and catheter positions as described without pneumothorax. Patchy airspace opacity in the right base with small right effusion. Minimal left effusion with mild left base atelectasis. No change in cardiac silhouette.   Electronically Signed   By: Lowella Grip III M.D.   On: 08/24/2014 12:55   Dg Chest Portable 1 View  08/24/2014   CLINICAL DATA:  Hypoxia  EXAM: PORTABLE CHEST - 1 VIEW  COMPARISON:  August 24, 2014  FINDINGS: Endotracheal tube tip is 2.4 cm above the  carina. Nasogastric tube tip and side port are below the diaphragm. No pneumothorax. There is atelectatic change in the right base. The lungs elsewhere clear. Heart is borderline enlarged with pulmonary vascularity within normal limits. No adenopathy. There is old trauma involving the lateral left clavicle, stable.  IMPRESSION: Tube positions as described without pneumothorax. Atelectatic change right base, slightly increased from earlier in the day. Stable cardiac prominence.   Electronically Signed   By: Lowella Grip III M.D.   On: 08/24/2014 11:16   Dg Chest Portable 1 View  08/24/2014  CLINICAL DATA:  Patient slid out of chair, very short of breath, RIGHT hip pain, altered mental status  EXAM: PORTABLE CHEST - 1 VIEW  COMPARISON:  Portable exam 1011 hours compared to 11/04/2013  FINDINGS: Enlargement of cardiac silhouette.  Atherosclerotic calcification aorta.  Mediastinal contours and pulmonary vascularity normal.  Mild elevation of RIGHT diaphragm with RIGHT basilar atelectasis.  Minimal central peribronchial thickening.  No definite infiltrate, pleural effusion or pneumothorax.  IMPRESSION: Enlargement of cardiac silhouette.  Bronchitic changes with RIGHT basilar atelectasis.   Electronically Signed   By: Lavonia Dana M.D.   On: 08/24/2014 10:34   Dg Hip Port Unilat With Pelvis 1v Right  08/24/2014   CLINICAL DATA:  Acute right hip pain after sliding out of a chair. Initial encounter.  EXAM: RIGHT HIP (WITH PELVIS) 1 VIEW PORTABLE  COMPARISON:  None.  FINDINGS: No acute fracture is identified. The right hip is located. Mild bilateral hip osteoarthrosis is noted. No lytic or blastic osseous lesion is identified.  IMPRESSION: No acute osseous abnormality identified.   Electronically Signed   By: Logan Bores   On: 08/24/2014 10:34    Microbiology: Recent Results (from the past 240 hour(s))  Blood Culture (routine x 2)     Status: None (Preliminary result)   Collection Time: 08/24/14 10:00 AM   Result Value Ref Range Status   Specimen Description BLOOD LEFT HAND  Final   Special Requests   Final    BOTTLES DRAWN AEROBIC AND ANAEROBIC BLUE 10ML RED 5ML   Culture NO GROWTH 2 DAYS  Final   Report Status PENDING  Incomplete  Blood Culture (routine x 2)     Status: None (Preliminary result)   Collection Time: 08/24/14 10:45 AM  Result Value Ref Range Status   Specimen Description BLOOD RIGHT ANTECUBITAL  Final   Special Requests BOTTLES DRAWN AEROBIC AND ANAEROBIC 5ML  Final   Culture NO GROWTH 2 DAYS  Final   Report Status PENDING  Incomplete  Culture, respiratory (tracheal aspirate)     Status: None (Preliminary result)   Collection Time: 08/24/14  1:55 PM  Result Value Ref Range Status   Specimen Description TRACHEAL ASPIRATE  Final   Special Requests NONE  Final   Gram Stain   Final    MODERATE WBC PRESENT, PREDOMINANTLY PMN RARE SQUAMOUS EPITHELIAL CELLS PRESENT FEW GRAM POSITIVE COCCI IN PAIRS IN CHAINS Performed at [facility]    Culture   Final    Non-Pathogenic Oropharyngeal-type Flora Isolated. Performed at [facility]    Report Status PENDING  Incomplete  MRSA culture     Status: None   Collection Time: 08/24/14  2:02 PM  Result Value Ref Range Status   Specimen Description NASOPHARYNGEAL  Final   Special Requests NONE  Final   Culture NOMRSA Performed at [facility]   Final   Report Status 08/26/2014 FINAL  Final  Urine culture     Status: None   Collection Time: 08/24/14  3:28 PM  Result Value Ref Range Status   Specimen Description URINE, RANDOM  Final   Special Requests NONE  Final   Culture NO GROWTH 1 DAY  Final   Report Status 08/25/2014 FINAL  Final     Labs: Basic Metabolic Panel:  Recent Labs Lab 08/25/14 08/25/14 0345 08/25/14 1710 08/26/14 0540 08/27/14 0310  NA 139 140 139 135 140  K 4.2 4.4 3.9 3.7 4.7  CL 109 110 110 105 109  CO2 22  23 22 23 25   GLUCOSE 135* 147* 221* 130* 126*  BUN 29* 31* 29*  26* 26*  CREATININE 2.80* 3.01* 3.11* 3.03* 3.17*  CALCIUM 7.5* 7.5* 7.6* 7.6* 8.2*  MG 2.3 2.3  --   --   --   PHOS  --  5.1*  --   --   --    Liver Function Tests:  Recent Labs Lab 08/24/14 1000  AST 77*  ALT 26  ALKPHOS 63  BILITOT 0.6  PROT 7.2  ALBUMIN 3.6   No results for input(s): LIPASE, AMYLASE in the last 168 hours. No results for input(s): AMMONIA in the last 168 hours. CBC:  Recent Labs Lab 08/24/14 1000 08/25/14 0345 08/26/14 0540 08/27/14 0310  WBC 15.8* 11.2* 7.7 9.3  NEUTROABS 12.5*  --   --   --   HGB 15.5 13.7 12.5* 13.0  HCT 48.1 43.2 39.5 40.4  MCV 86.8 88.3 86.6 85.8  PLT 200 202 158 190   Cardiac Enzymes:  Recent Labs Lab 08/24/14 1152 08/24/14 1512 08/24/14 1820 08/25/14 08/25/14 0345 08/25/14 0800 08/26/14 0540 08/26/14 1634  CKTOTAL 7042*  --   --   --  97588*  --  9158* 9246*  TROPONINI  --  0.39* 0.74* 0.73*  --  0.49*  --   --    BNP: BNP (last 3 results) No results for input(s): BNP in the last 8760 hours.  ProBNP (last 3 results) No results for input(s): PROBNP in the last 8760 hours.  CBG:  Recent Labs Lab 08/26/14 0737 08/26/14 1309 08/26/14 1603 08/26/14 2101 08/27/14 0620  GLUCAP 102* 123* 129* 101* 109*       Signed:  Florie Carico  [facility] 08/27/2014, 9:30 AM

## 2014-08-29 DIAGNOSIS — Z79899 Other long term (current) drug therapy: Secondary | ICD-10-CM | POA: Diagnosis not present

## 2014-08-29 DIAGNOSIS — M549 Dorsalgia, unspecified: Secondary | ICD-10-CM | POA: Diagnosis not present

## 2014-08-29 DIAGNOSIS — N183 Chronic kidney disease, stage 3 (moderate): Secondary | ICD-10-CM | POA: Diagnosis not present

## 2014-08-29 DIAGNOSIS — I10 Essential (primary) hypertension: Secondary | ICD-10-CM | POA: Diagnosis not present

## 2014-08-29 DIAGNOSIS — E119 Type 2 diabetes mellitus without complications: Secondary | ICD-10-CM | POA: Diagnosis not present

## 2014-08-29 LAB — CULTURE, BLOOD (ROUTINE X 2)
Culture: NO GROWTH
Culture: NO GROWTH

## 2014-08-30 DIAGNOSIS — N289 Disorder of kidney and ureter, unspecified: Secondary | ICD-10-CM | POA: Diagnosis not present

## 2014-08-30 DIAGNOSIS — N401 Enlarged prostate with lower urinary tract symptoms: Secondary | ICD-10-CM | POA: Diagnosis not present

## 2014-08-30 DIAGNOSIS — N3090 Cystitis, unspecified without hematuria: Secondary | ICD-10-CM | POA: Diagnosis not present

## 2014-08-30 DIAGNOSIS — D4102 Neoplasm of uncertain behavior of left kidney: Secondary | ICD-10-CM | POA: Diagnosis not present

## 2014-09-07 DIAGNOSIS — E119 Type 2 diabetes mellitus without complications: Secondary | ICD-10-CM | POA: Diagnosis not present

## 2014-09-07 DIAGNOSIS — N183 Chronic kidney disease, stage 3 (moderate): Secondary | ICD-10-CM | POA: Diagnosis not present

## 2014-09-15 DIAGNOSIS — C642 Malignant neoplasm of left kidney, except renal pelvis: Secondary | ICD-10-CM | POA: Diagnosis not present

## 2014-09-15 DIAGNOSIS — K5730 Diverticulosis of large intestine without perforation or abscess without bleeding: Secondary | ICD-10-CM | POA: Diagnosis not present

## 2014-09-15 DIAGNOSIS — I2510 Atherosclerotic heart disease of native coronary artery without angina pectoris: Secondary | ICD-10-CM | POA: Diagnosis not present

## 2014-09-15 DIAGNOSIS — I700 Atherosclerosis of aorta: Secondary | ICD-10-CM | POA: Diagnosis not present

## 2014-09-15 DIAGNOSIS — Z905 Acquired absence of kidney: Secondary | ICD-10-CM | POA: Diagnosis not present

## 2014-09-15 DIAGNOSIS — N289 Disorder of kidney and ureter, unspecified: Secondary | ICD-10-CM | POA: Diagnosis not present

## 2014-09-19 DIAGNOSIS — C642 Malignant neoplasm of left kidney, except renal pelvis: Secondary | ICD-10-CM | POA: Diagnosis not present

## 2014-09-20 DIAGNOSIS — I10 Essential (primary) hypertension: Secondary | ICD-10-CM | POA: Insufficient documentation

## 2014-09-20 DIAGNOSIS — E669 Obesity, unspecified: Secondary | ICD-10-CM

## 2014-09-20 DIAGNOSIS — R002 Palpitations: Secondary | ICD-10-CM

## 2014-09-20 DIAGNOSIS — I491 Atrial premature depolarization: Secondary | ICD-10-CM | POA: Insufficient documentation

## 2014-09-20 DIAGNOSIS — E7800 Pure hypercholesterolemia, unspecified: Secondary | ICD-10-CM | POA: Insufficient documentation

## 2014-09-20 DIAGNOSIS — Q231 Congenital insufficiency of aortic valve: Secondary | ICD-10-CM | POA: Insufficient documentation

## 2014-09-20 DIAGNOSIS — Z01810 Encounter for preprocedural cardiovascular examination: Secondary | ICD-10-CM | POA: Insufficient documentation

## 2014-09-20 HISTORY — DX: Congenital insufficiency of aortic valve: Q23.1

## 2014-09-20 HISTORY — DX: Pure hypercholesterolemia, unspecified: E78.00

## 2014-09-20 HISTORY — DX: Atrial premature depolarization: I49.1

## 2014-09-20 HISTORY — DX: Encounter for preprocedural cardiovascular examination: Z01.810

## 2014-09-20 HISTORY — DX: Essential (primary) hypertension: I10

## 2014-09-20 HISTORY — DX: Obesity, unspecified: E66.9

## 2014-09-20 HISTORY — DX: Palpitations: R00.2

## 2014-09-22 DIAGNOSIS — E780 Pure hypercholesterolemia: Secondary | ICD-10-CM | POA: Diagnosis not present

## 2014-09-22 DIAGNOSIS — Q231 Congenital insufficiency of aortic valve: Secondary | ICD-10-CM | POA: Diagnosis not present

## 2014-09-22 DIAGNOSIS — I10 Essential (primary) hypertension: Secondary | ICD-10-CM | POA: Diagnosis not present

## 2014-09-22 DIAGNOSIS — I491 Atrial premature depolarization: Secondary | ICD-10-CM | POA: Diagnosis not present

## 2014-09-28 DIAGNOSIS — N183 Chronic kidney disease, stage 3 (moderate): Secondary | ICD-10-CM | POA: Diagnosis not present

## 2014-09-28 DIAGNOSIS — G894 Chronic pain syndrome: Secondary | ICD-10-CM | POA: Diagnosis not present

## 2014-09-28 DIAGNOSIS — I10 Essential (primary) hypertension: Secondary | ICD-10-CM | POA: Diagnosis not present

## 2014-09-28 DIAGNOSIS — E119 Type 2 diabetes mellitus without complications: Secondary | ICD-10-CM | POA: Diagnosis not present

## 2014-09-28 DIAGNOSIS — Z79899 Other long term (current) drug therapy: Secondary | ICD-10-CM | POA: Diagnosis not present

## 2014-10-13 DIAGNOSIS — Q231 Congenital insufficiency of aortic valve: Secondary | ICD-10-CM | POA: Diagnosis not present

## 2014-10-21 DIAGNOSIS — E109 Type 1 diabetes mellitus without complications: Secondary | ICD-10-CM | POA: Diagnosis not present

## 2014-10-26 DIAGNOSIS — E119 Type 2 diabetes mellitus without complications: Secondary | ICD-10-CM | POA: Diagnosis not present

## 2014-10-26 DIAGNOSIS — G894 Chronic pain syndrome: Secondary | ICD-10-CM | POA: Diagnosis not present

## 2014-10-26 DIAGNOSIS — Z79899 Other long term (current) drug therapy: Secondary | ICD-10-CM | POA: Diagnosis not present

## 2014-10-26 DIAGNOSIS — F419 Anxiety disorder, unspecified: Secondary | ICD-10-CM | POA: Diagnosis not present

## 2014-10-26 DIAGNOSIS — I10 Essential (primary) hypertension: Secondary | ICD-10-CM | POA: Diagnosis not present

## 2014-11-25 DIAGNOSIS — N183 Chronic kidney disease, stage 3 (moderate): Secondary | ICD-10-CM | POA: Diagnosis not present

## 2014-11-25 DIAGNOSIS — Z79899 Other long term (current) drug therapy: Secondary | ICD-10-CM | POA: Diagnosis not present

## 2014-11-25 DIAGNOSIS — M549 Dorsalgia, unspecified: Secondary | ICD-10-CM | POA: Diagnosis not present

## 2014-11-25 DIAGNOSIS — F419 Anxiety disorder, unspecified: Secondary | ICD-10-CM | POA: Diagnosis not present

## 2014-11-25 DIAGNOSIS — E785 Hyperlipidemia, unspecified: Secondary | ICD-10-CM | POA: Diagnosis not present

## 2014-11-25 DIAGNOSIS — G894 Chronic pain syndrome: Secondary | ICD-10-CM | POA: Diagnosis not present

## 2014-11-25 DIAGNOSIS — E119 Type 2 diabetes mellitus without complications: Secondary | ICD-10-CM | POA: Diagnosis not present

## 2014-12-23 DIAGNOSIS — G894 Chronic pain syndrome: Secondary | ICD-10-CM | POA: Diagnosis not present

## 2014-12-23 DIAGNOSIS — Z79899 Other long term (current) drug therapy: Secondary | ICD-10-CM | POA: Diagnosis not present

## 2014-12-23 DIAGNOSIS — E119 Type 2 diabetes mellitus without complications: Secondary | ICD-10-CM | POA: Diagnosis not present

## 2014-12-23 DIAGNOSIS — N183 Chronic kidney disease, stage 3 (moderate): Secondary | ICD-10-CM | POA: Diagnosis not present

## 2014-12-23 DIAGNOSIS — M549 Dorsalgia, unspecified: Secondary | ICD-10-CM | POA: Diagnosis not present

## 2014-12-23 DIAGNOSIS — I10 Essential (primary) hypertension: Secondary | ICD-10-CM | POA: Diagnosis not present

## 2015-01-20 DIAGNOSIS — E109 Type 1 diabetes mellitus without complications: Secondary | ICD-10-CM | POA: Diagnosis not present

## 2015-01-23 DIAGNOSIS — E119 Type 2 diabetes mellitus without complications: Secondary | ICD-10-CM | POA: Diagnosis not present

## 2015-01-23 DIAGNOSIS — M549 Dorsalgia, unspecified: Secondary | ICD-10-CM | POA: Diagnosis not present

## 2015-01-23 DIAGNOSIS — G894 Chronic pain syndrome: Secondary | ICD-10-CM | POA: Diagnosis not present

## 2015-01-23 DIAGNOSIS — I10 Essential (primary) hypertension: Secondary | ICD-10-CM | POA: Diagnosis not present

## 2015-01-23 DIAGNOSIS — Z79899 Other long term (current) drug therapy: Secondary | ICD-10-CM | POA: Diagnosis not present

## 2015-02-22 DIAGNOSIS — E119 Type 2 diabetes mellitus without complications: Secondary | ICD-10-CM | POA: Diagnosis not present

## 2015-02-22 DIAGNOSIS — Z79899 Other long term (current) drug therapy: Secondary | ICD-10-CM | POA: Diagnosis not present

## 2015-02-22 DIAGNOSIS — E785 Hyperlipidemia, unspecified: Secondary | ICD-10-CM | POA: Diagnosis not present

## 2015-02-22 DIAGNOSIS — G894 Chronic pain syndrome: Secondary | ICD-10-CM | POA: Diagnosis not present

## 2015-02-22 DIAGNOSIS — I10 Essential (primary) hypertension: Secondary | ICD-10-CM | POA: Diagnosis not present

## 2015-03-27 DIAGNOSIS — I10 Essential (primary) hypertension: Secondary | ICD-10-CM | POA: Diagnosis not present

## 2015-03-27 DIAGNOSIS — E119 Type 2 diabetes mellitus without complications: Secondary | ICD-10-CM | POA: Diagnosis not present

## 2015-03-27 DIAGNOSIS — Z79899 Other long term (current) drug therapy: Secondary | ICD-10-CM | POA: Diagnosis not present

## 2015-03-27 DIAGNOSIS — M549 Dorsalgia, unspecified: Secondary | ICD-10-CM | POA: Diagnosis not present

## 2015-03-27 DIAGNOSIS — G894 Chronic pain syndrome: Secondary | ICD-10-CM | POA: Diagnosis not present

## 2015-04-24 DIAGNOSIS — E119 Type 2 diabetes mellitus without complications: Secondary | ICD-10-CM | POA: Diagnosis not present

## 2015-04-24 DIAGNOSIS — E785 Hyperlipidemia, unspecified: Secondary | ICD-10-CM | POA: Diagnosis not present

## 2015-04-24 DIAGNOSIS — G894 Chronic pain syndrome: Secondary | ICD-10-CM | POA: Diagnosis not present

## 2015-04-24 DIAGNOSIS — M549 Dorsalgia, unspecified: Secondary | ICD-10-CM | POA: Diagnosis not present

## 2015-04-24 DIAGNOSIS — I10 Essential (primary) hypertension: Secondary | ICD-10-CM | POA: Diagnosis not present

## 2015-04-24 DIAGNOSIS — Z5181 Encounter for therapeutic drug level monitoring: Secondary | ICD-10-CM | POA: Diagnosis not present

## 2015-05-01 DIAGNOSIS — E109 Type 1 diabetes mellitus without complications: Secondary | ICD-10-CM | POA: Diagnosis not present

## 2015-05-24 DIAGNOSIS — I10 Essential (primary) hypertension: Secondary | ICD-10-CM | POA: Diagnosis not present

## 2015-05-24 DIAGNOSIS — G894 Chronic pain syndrome: Secondary | ICD-10-CM | POA: Diagnosis not present

## 2015-05-24 DIAGNOSIS — M549 Dorsalgia, unspecified: Secondary | ICD-10-CM | POA: Diagnosis not present

## 2015-05-24 DIAGNOSIS — E785 Hyperlipidemia, unspecified: Secondary | ICD-10-CM | POA: Diagnosis not present

## 2015-05-24 DIAGNOSIS — E119 Type 2 diabetes mellitus without complications: Secondary | ICD-10-CM | POA: Diagnosis not present

## 2015-06-14 DIAGNOSIS — I2510 Atherosclerotic heart disease of native coronary artery without angina pectoris: Secondary | ICD-10-CM | POA: Insufficient documentation

## 2015-06-14 DIAGNOSIS — Q231 Congenital insufficiency of aortic valve: Secondary | ICD-10-CM | POA: Diagnosis not present

## 2015-06-14 DIAGNOSIS — E7800 Pure hypercholesterolemia, unspecified: Secondary | ICD-10-CM | POA: Diagnosis not present

## 2015-06-14 DIAGNOSIS — I491 Atrial premature depolarization: Secondary | ICD-10-CM | POA: Diagnosis not present

## 2015-06-14 DIAGNOSIS — R002 Palpitations: Secondary | ICD-10-CM | POA: Diagnosis not present

## 2015-06-14 HISTORY — DX: Atherosclerotic heart disease of native coronary artery without angina pectoris: I25.10

## 2015-06-16 DIAGNOSIS — C642 Malignant neoplasm of left kidney, except renal pelvis: Secondary | ICD-10-CM

## 2015-06-16 DIAGNOSIS — Z85528 Personal history of other malignant neoplasm of kidney: Secondary | ICD-10-CM | POA: Diagnosis not present

## 2015-06-21 DIAGNOSIS — G894 Chronic pain syndrome: Secondary | ICD-10-CM | POA: Diagnosis not present

## 2015-06-21 DIAGNOSIS — M549 Dorsalgia, unspecified: Secondary | ICD-10-CM | POA: Diagnosis not present

## 2015-06-21 DIAGNOSIS — E119 Type 2 diabetes mellitus without complications: Secondary | ICD-10-CM | POA: Diagnosis not present

## 2015-06-21 DIAGNOSIS — N183 Chronic kidney disease, stage 3 (moderate): Secondary | ICD-10-CM | POA: Diagnosis not present

## 2015-06-21 DIAGNOSIS — I10 Essential (primary) hypertension: Secondary | ICD-10-CM | POA: Diagnosis not present

## 2015-06-30 DIAGNOSIS — C44311 Basal cell carcinoma of skin of nose: Secondary | ICD-10-CM | POA: Diagnosis not present

## 2015-06-30 DIAGNOSIS — L821 Other seborrheic keratosis: Secondary | ICD-10-CM | POA: Diagnosis not present

## 2015-07-19 DIAGNOSIS — L570 Actinic keratosis: Secondary | ICD-10-CM | POA: Diagnosis not present

## 2015-07-24 DIAGNOSIS — Z5181 Encounter for therapeutic drug level monitoring: Secondary | ICD-10-CM | POA: Diagnosis not present

## 2015-07-24 DIAGNOSIS — G894 Chronic pain syndrome: Secondary | ICD-10-CM | POA: Diagnosis not present

## 2015-07-24 DIAGNOSIS — E119 Type 2 diabetes mellitus without complications: Secondary | ICD-10-CM | POA: Diagnosis not present

## 2015-07-24 DIAGNOSIS — M549 Dorsalgia, unspecified: Secondary | ICD-10-CM | POA: Diagnosis not present

## 2015-07-24 DIAGNOSIS — Z79891 Long term (current) use of opiate analgesic: Secondary | ICD-10-CM | POA: Diagnosis not present

## 2015-07-24 DIAGNOSIS — I10 Essential (primary) hypertension: Secondary | ICD-10-CM | POA: Diagnosis not present

## 2015-08-01 DIAGNOSIS — E109 Type 1 diabetes mellitus without complications: Secondary | ICD-10-CM | POA: Diagnosis not present

## 2015-08-21 DIAGNOSIS — M549 Dorsalgia, unspecified: Secondary | ICD-10-CM | POA: Diagnosis not present

## 2015-08-21 DIAGNOSIS — I10 Essential (primary) hypertension: Secondary | ICD-10-CM | POA: Diagnosis not present

## 2015-08-21 DIAGNOSIS — E785 Hyperlipidemia, unspecified: Secondary | ICD-10-CM | POA: Diagnosis not present

## 2015-08-21 DIAGNOSIS — E1140 Type 2 diabetes mellitus with diabetic neuropathy, unspecified: Secondary | ICD-10-CM | POA: Diagnosis not present

## 2015-08-21 DIAGNOSIS — E119 Type 2 diabetes mellitus without complications: Secondary | ICD-10-CM | POA: Diagnosis not present

## 2015-08-21 DIAGNOSIS — Z5181 Encounter for therapeutic drug level monitoring: Secondary | ICD-10-CM | POA: Diagnosis not present

## 2015-09-02 IMAGING — CR DG CHEST 1V PORT
1 series · 1 of 1 positions shown · non-contrast
Comparison: Portable exam 6466 hours compared to 11/04/2013

CLINICAL DATA: Patient slid out of chair, very short of breath,
RIGHT hip pain, altered mental status

EXAM:
PORTABLE CHEST - 1 VIEW

[AP]
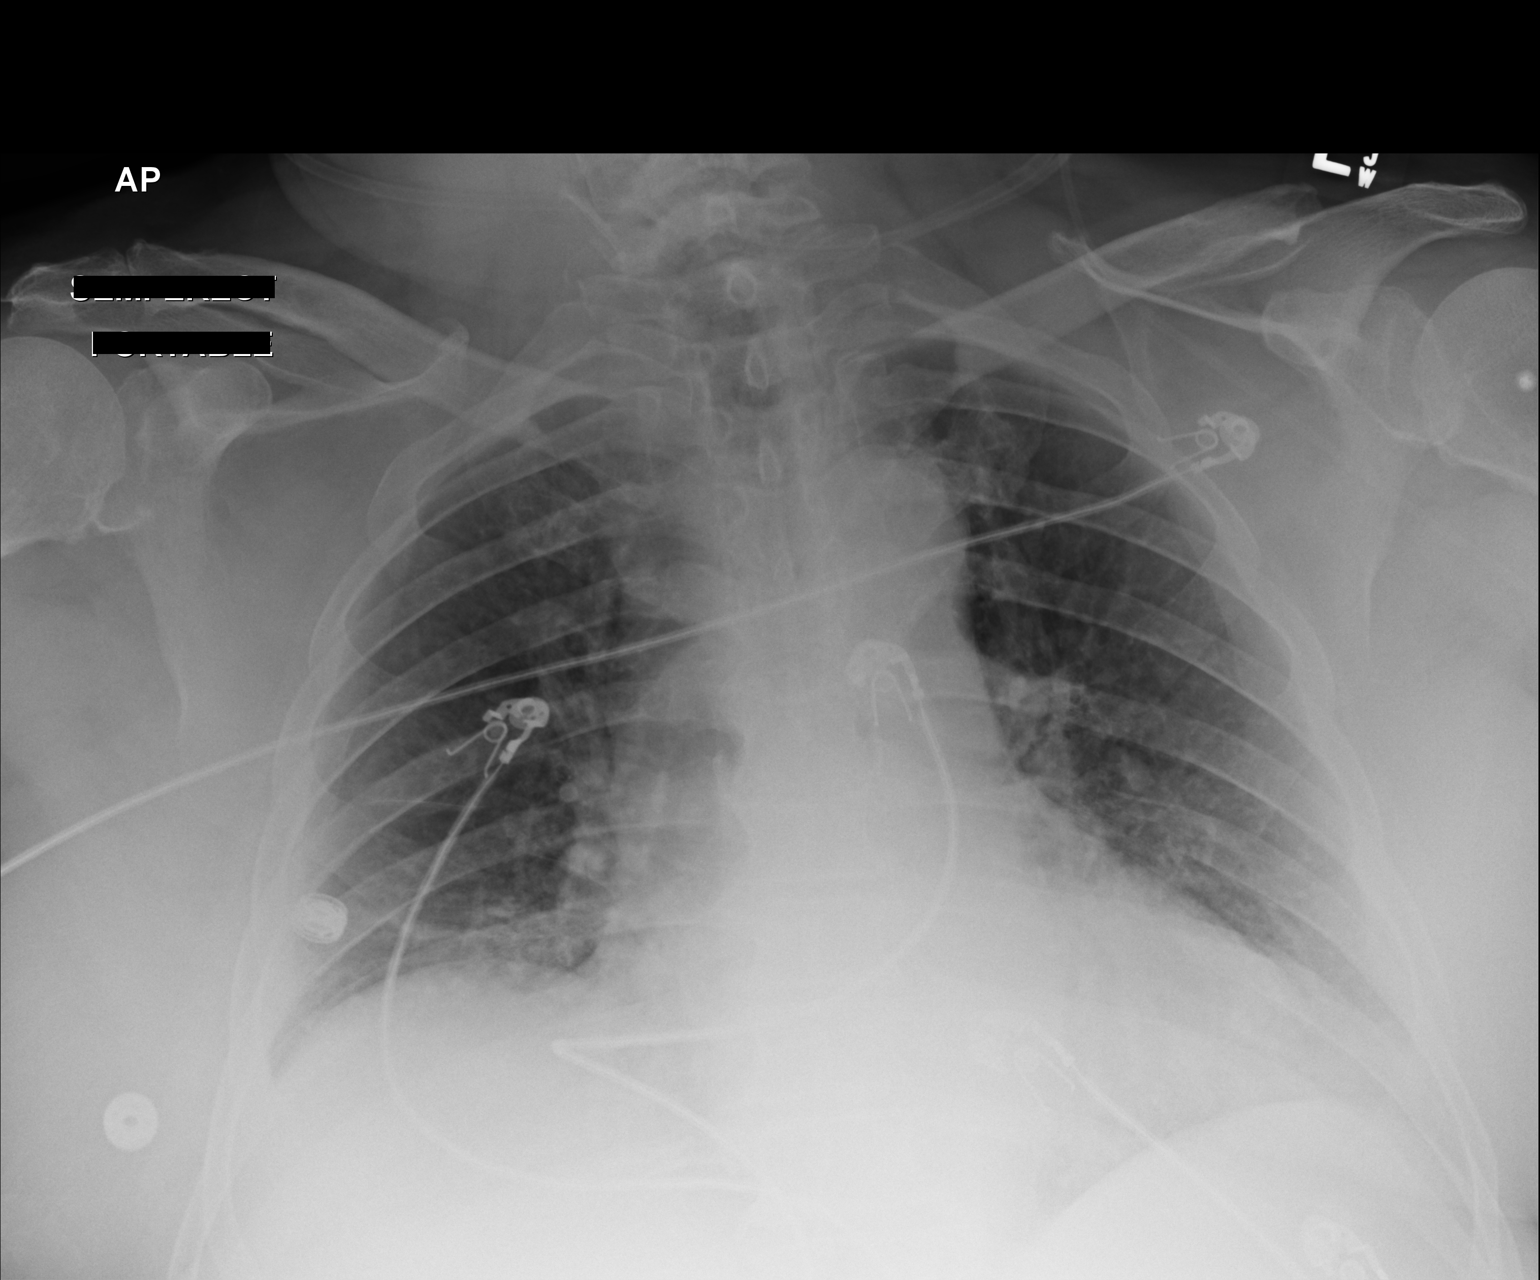

[1 of 1 positions shown; findings below may reference images not displayed]

FINDINGS: Enlargement of cardiac silhouette.

Atherosclerotic calcification aorta.

Mediastinal contours and pulmonary vascularity normal.

Mild elevation of RIGHT diaphragm with RIGHT basilar atelectasis.

Minimal central peribronchial thickening.

No definite infiltrate, pleural effusion or pneumothorax.
IMPRESSION: Enlargement of cardiac silhouette.

Bronchitic changes with RIGHT basilar atelectasis.

## 2015-09-03 IMAGING — CR DG CHEST 1V PORT
1 series · 1 of 1 positions shown · non-contrast
Comparison: 08/24/2014.

CLINICAL DATA: Respiratory failure.  Shortness of breath.

EXAM:
PORTABLE CHEST - 1 VIEW

[AP]
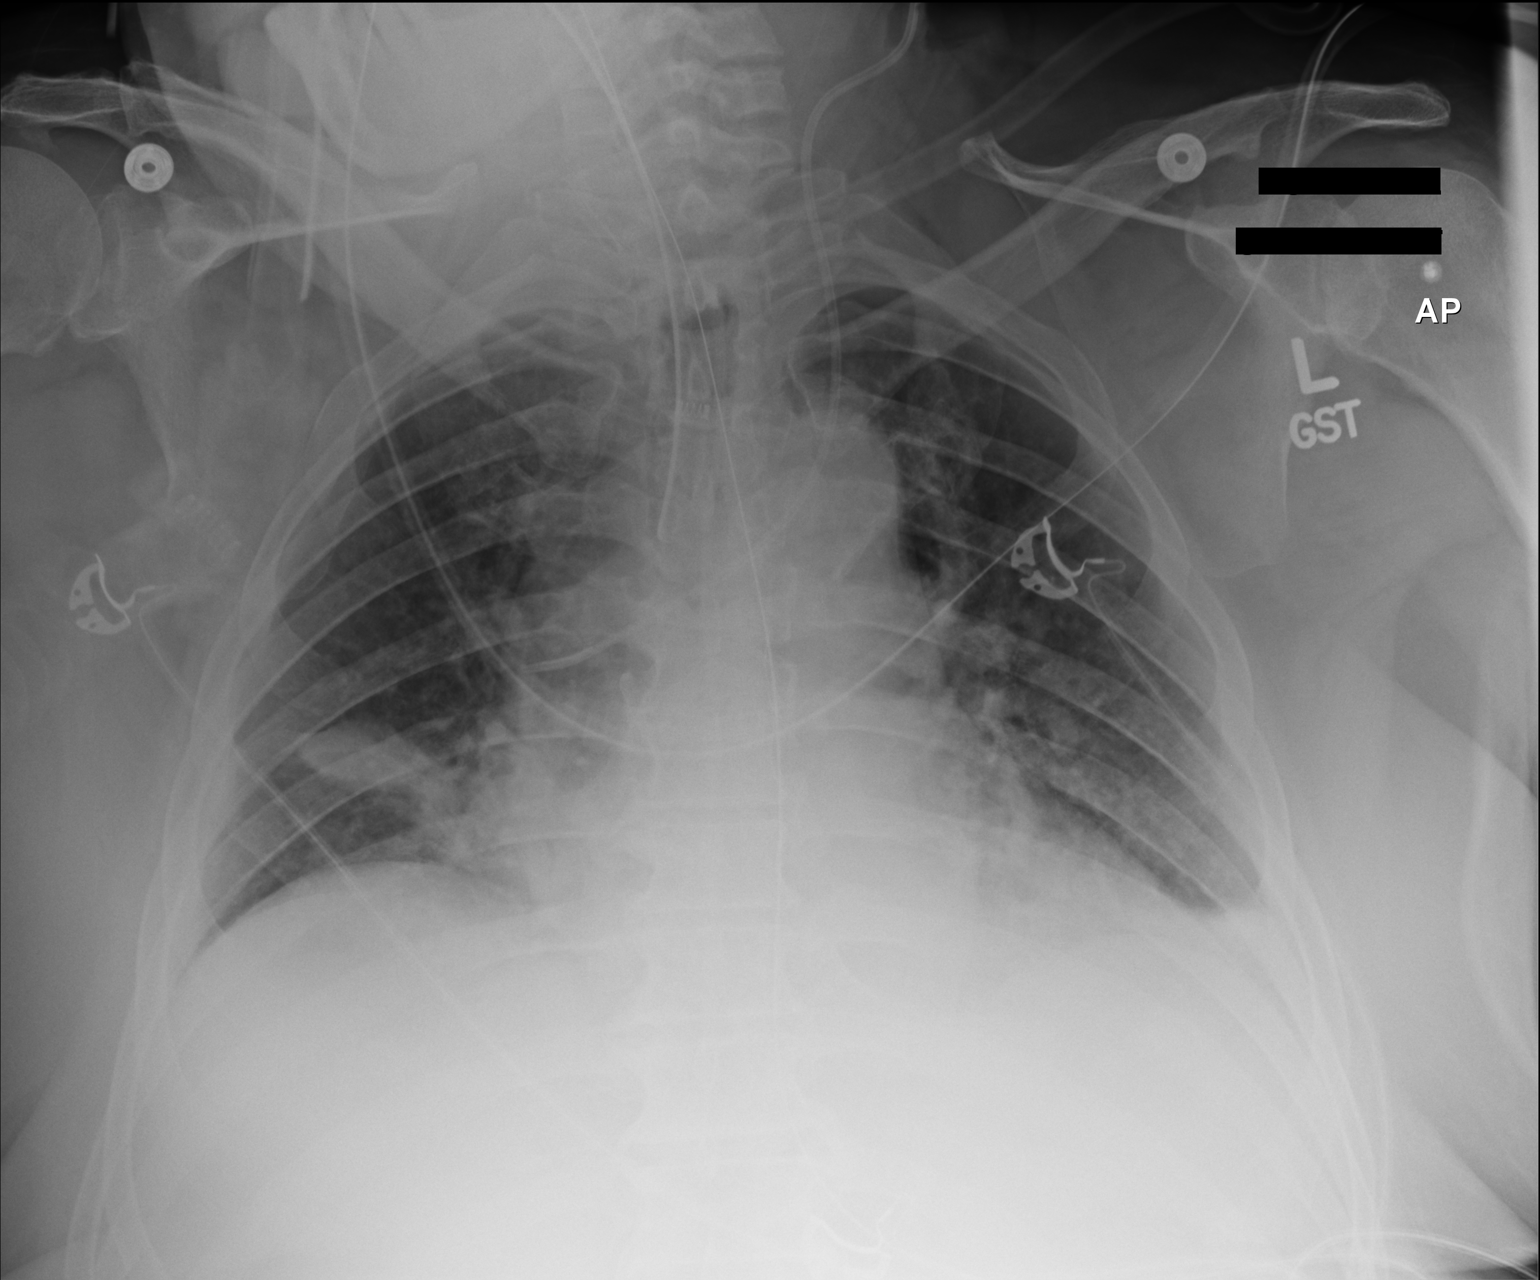

[1 of 1 positions shown; findings below may reference images not displayed]

FINDINGS: Endotracheal tube, left IJ line, NG tube in stable position. Heart
size stable. Left lower lobe infiltrate noted consistent with
pneumonia. Small left pleural effusion. Right base subsegmental
atelectasis. No pneumothorax.
IMPRESSION: 1. Lines and tubes in stable position.
2. Left lower lobe infiltrate consistent with pneumonia. Small left
pleural effusion.
3. Right base subsegmental atelectasis.

## 2015-09-25 DIAGNOSIS — G8929 Other chronic pain: Secondary | ICD-10-CM | POA: Diagnosis not present

## 2015-09-25 DIAGNOSIS — M5440 Lumbago with sciatica, unspecified side: Secondary | ICD-10-CM | POA: Diagnosis not present

## 2015-09-25 DIAGNOSIS — E1140 Type 2 diabetes mellitus with diabetic neuropathy, unspecified: Secondary | ICD-10-CM | POA: Diagnosis not present

## 2015-09-25 DIAGNOSIS — Z5181 Encounter for therapeutic drug level monitoring: Secondary | ICD-10-CM | POA: Diagnosis not present

## 2015-09-25 DIAGNOSIS — I10 Essential (primary) hypertension: Secondary | ICD-10-CM | POA: Diagnosis not present

## 2015-10-11 DIAGNOSIS — Q231 Congenital insufficiency of aortic valve: Secondary | ICD-10-CM | POA: Diagnosis not present

## 2015-10-18 DIAGNOSIS — C44311 Basal cell carcinoma of skin of nose: Secondary | ICD-10-CM | POA: Diagnosis not present

## 2015-10-24 DIAGNOSIS — E1140 Type 2 diabetes mellitus with diabetic neuropathy, unspecified: Secondary | ICD-10-CM | POA: Diagnosis not present

## 2015-10-24 DIAGNOSIS — G8929 Other chronic pain: Secondary | ICD-10-CM | POA: Diagnosis not present

## 2015-10-24 DIAGNOSIS — Z0000 Encounter for general adult medical examination without abnormal findings: Secondary | ICD-10-CM | POA: Diagnosis not present

## 2015-10-24 DIAGNOSIS — I10 Essential (primary) hypertension: Secondary | ICD-10-CM | POA: Diagnosis not present

## 2015-10-24 DIAGNOSIS — Z5181 Encounter for therapeutic drug level monitoring: Secondary | ICD-10-CM | POA: Diagnosis not present

## 2015-10-24 DIAGNOSIS — Z1389 Encounter for screening for other disorder: Secondary | ICD-10-CM | POA: Diagnosis not present

## 2015-10-24 DIAGNOSIS — E785 Hyperlipidemia, unspecified: Secondary | ICD-10-CM | POA: Diagnosis not present

## 2015-11-06 DIAGNOSIS — E109 Type 1 diabetes mellitus without complications: Secondary | ICD-10-CM | POA: Diagnosis not present

## 2015-11-22 DIAGNOSIS — Z5181 Encounter for therapeutic drug level monitoring: Secondary | ICD-10-CM | POA: Diagnosis not present

## 2015-11-22 DIAGNOSIS — M5441 Lumbago with sciatica, right side: Secondary | ICD-10-CM | POA: Diagnosis not present

## 2015-11-22 DIAGNOSIS — I10 Essential (primary) hypertension: Secondary | ICD-10-CM | POA: Diagnosis not present

## 2015-11-22 DIAGNOSIS — Z23 Encounter for immunization: Secondary | ICD-10-CM | POA: Diagnosis not present

## 2015-11-22 DIAGNOSIS — M5442 Lumbago with sciatica, left side: Secondary | ICD-10-CM | POA: Diagnosis not present

## 2015-11-22 DIAGNOSIS — E1165 Type 2 diabetes mellitus with hyperglycemia: Secondary | ICD-10-CM | POA: Diagnosis not present

## 2015-11-22 DIAGNOSIS — G8929 Other chronic pain: Secondary | ICD-10-CM | POA: Diagnosis not present

## 2015-12-22 DIAGNOSIS — E1140 Type 2 diabetes mellitus with diabetic neuropathy, unspecified: Secondary | ICD-10-CM | POA: Diagnosis not present

## 2015-12-22 DIAGNOSIS — I1310 Hypertensive heart and chronic kidney disease without heart failure, with stage 1 through stage 4 chronic kidney disease, or unspecified chronic kidney disease: Secondary | ICD-10-CM | POA: Diagnosis not present

## 2015-12-22 DIAGNOSIS — I509 Heart failure, unspecified: Secondary | ICD-10-CM | POA: Diagnosis not present

## 2015-12-22 DIAGNOSIS — M546 Pain in thoracic spine: Secondary | ICD-10-CM | POA: Diagnosis not present

## 2015-12-22 DIAGNOSIS — G8929 Other chronic pain: Secondary | ICD-10-CM | POA: Diagnosis not present

## 2015-12-22 DIAGNOSIS — Z5181 Encounter for therapeutic drug level monitoring: Secondary | ICD-10-CM | POA: Diagnosis not present

## 2015-12-26 DIAGNOSIS — C642 Malignant neoplasm of left kidney, except renal pelvis: Secondary | ICD-10-CM | POA: Diagnosis not present

## 2015-12-26 DIAGNOSIS — E7800 Pure hypercholesterolemia, unspecified: Secondary | ICD-10-CM | POA: Diagnosis not present

## 2015-12-26 DIAGNOSIS — Q231 Congenital insufficiency of aortic valve: Secondary | ICD-10-CM | POA: Diagnosis not present

## 2015-12-26 DIAGNOSIS — I2510 Atherosclerotic heart disease of native coronary artery without angina pectoris: Secondary | ICD-10-CM | POA: Diagnosis not present

## 2015-12-26 DIAGNOSIS — Z85528 Personal history of other malignant neoplasm of kidney: Secondary | ICD-10-CM | POA: Diagnosis not present

## 2015-12-26 DIAGNOSIS — I491 Atrial premature depolarization: Secondary | ICD-10-CM | POA: Diagnosis not present

## 2015-12-26 DIAGNOSIS — R002 Palpitations: Secondary | ICD-10-CM | POA: Diagnosis not present

## 2015-12-27 DIAGNOSIS — Z85528 Personal history of other malignant neoplasm of kidney: Secondary | ICD-10-CM | POA: Diagnosis not present

## 2015-12-27 DIAGNOSIS — C642 Malignant neoplasm of left kidney, except renal pelvis: Secondary | ICD-10-CM | POA: Diagnosis not present

## 2016-01-02 DIAGNOSIS — I491 Atrial premature depolarization: Secondary | ICD-10-CM | POA: Diagnosis not present

## 2016-01-02 DIAGNOSIS — I2510 Atherosclerotic heart disease of native coronary artery without angina pectoris: Secondary | ICD-10-CM | POA: Diagnosis not present

## 2016-01-02 DIAGNOSIS — Q231 Congenital insufficiency of aortic valve: Secondary | ICD-10-CM | POA: Diagnosis not present

## 2016-01-02 DIAGNOSIS — R002 Palpitations: Secondary | ICD-10-CM | POA: Diagnosis not present

## 2016-01-02 DIAGNOSIS — E7800 Pure hypercholesterolemia, unspecified: Secondary | ICD-10-CM | POA: Diagnosis not present

## 2016-01-10 DIAGNOSIS — R0609 Other forms of dyspnea: Secondary | ICD-10-CM | POA: Diagnosis not present

## 2016-01-22 DIAGNOSIS — N183 Chronic kidney disease, stage 3 (moderate): Secondary | ICD-10-CM | POA: Diagnosis not present

## 2016-01-22 DIAGNOSIS — E1140 Type 2 diabetes mellitus with diabetic neuropathy, unspecified: Secondary | ICD-10-CM | POA: Diagnosis not present

## 2016-01-22 DIAGNOSIS — E119 Type 2 diabetes mellitus without complications: Secondary | ICD-10-CM | POA: Diagnosis not present

## 2016-01-22 DIAGNOSIS — M5440 Lumbago with sciatica, unspecified side: Secondary | ICD-10-CM | POA: Diagnosis not present

## 2016-01-22 DIAGNOSIS — G8929 Other chronic pain: Secondary | ICD-10-CM | POA: Diagnosis not present

## 2016-01-22 DIAGNOSIS — Z5181 Encounter for therapeutic drug level monitoring: Secondary | ICD-10-CM | POA: Diagnosis not present

## 2016-01-22 DIAGNOSIS — I10 Essential (primary) hypertension: Secondary | ICD-10-CM | POA: Diagnosis not present

## 2016-02-05 DIAGNOSIS — E109 Type 1 diabetes mellitus without complications: Secondary | ICD-10-CM | POA: Diagnosis not present

## 2016-02-23 DIAGNOSIS — G894 Chronic pain syndrome: Secondary | ICD-10-CM | POA: Diagnosis not present

## 2016-02-23 DIAGNOSIS — Z5181 Encounter for therapeutic drug level monitoring: Secondary | ICD-10-CM | POA: Diagnosis not present

## 2016-02-23 DIAGNOSIS — E1140 Type 2 diabetes mellitus with diabetic neuropathy, unspecified: Secondary | ICD-10-CM | POA: Diagnosis not present

## 2016-02-23 DIAGNOSIS — I10 Essential (primary) hypertension: Secondary | ICD-10-CM | POA: Diagnosis not present

## 2016-02-23 DIAGNOSIS — M5440 Lumbago with sciatica, unspecified side: Secondary | ICD-10-CM | POA: Diagnosis not present

## 2016-03-11 DIAGNOSIS — E7800 Pure hypercholesterolemia, unspecified: Secondary | ICD-10-CM | POA: Diagnosis not present

## 2016-03-11 DIAGNOSIS — I2510 Atherosclerotic heart disease of native coronary artery without angina pectoris: Secondary | ICD-10-CM | POA: Diagnosis not present

## 2016-03-11 DIAGNOSIS — Q231 Congenital insufficiency of aortic valve: Secondary | ICD-10-CM | POA: Diagnosis not present

## 2016-03-11 DIAGNOSIS — R002 Palpitations: Secondary | ICD-10-CM | POA: Diagnosis not present

## 2016-03-12 DIAGNOSIS — Z79899 Other long term (current) drug therapy: Secondary | ICD-10-CM | POA: Diagnosis not present

## 2016-03-12 DIAGNOSIS — G894 Chronic pain syndrome: Secondary | ICD-10-CM | POA: Diagnosis not present

## 2016-03-12 DIAGNOSIS — E1140 Type 2 diabetes mellitus with diabetic neuropathy, unspecified: Secondary | ICD-10-CM | POA: Diagnosis not present

## 2016-03-12 DIAGNOSIS — Z5181 Encounter for therapeutic drug level monitoring: Secondary | ICD-10-CM | POA: Diagnosis not present

## 2016-03-22 DIAGNOSIS — Z5181 Encounter for therapeutic drug level monitoring: Secondary | ICD-10-CM | POA: Diagnosis not present

## 2016-03-22 DIAGNOSIS — E1140 Type 2 diabetes mellitus with diabetic neuropathy, unspecified: Secondary | ICD-10-CM | POA: Diagnosis not present

## 2016-03-22 DIAGNOSIS — I10 Essential (primary) hypertension: Secondary | ICD-10-CM | POA: Diagnosis not present

## 2016-03-22 DIAGNOSIS — G8929 Other chronic pain: Secondary | ICD-10-CM | POA: Diagnosis not present

## 2016-03-22 DIAGNOSIS — N401 Enlarged prostate with lower urinary tract symptoms: Secondary | ICD-10-CM | POA: Diagnosis not present

## 2016-03-22 DIAGNOSIS — M5440 Lumbago with sciatica, unspecified side: Secondary | ICD-10-CM | POA: Diagnosis not present

## 2016-04-19 DIAGNOSIS — E1140 Type 2 diabetes mellitus with diabetic neuropathy, unspecified: Secondary | ICD-10-CM | POA: Diagnosis not present

## 2016-04-19 DIAGNOSIS — E119 Type 2 diabetes mellitus without complications: Secondary | ICD-10-CM | POA: Diagnosis not present

## 2016-04-19 DIAGNOSIS — I10 Essential (primary) hypertension: Secondary | ICD-10-CM | POA: Diagnosis not present

## 2016-04-19 DIAGNOSIS — G894 Chronic pain syndrome: Secondary | ICD-10-CM | POA: Diagnosis not present

## 2016-04-19 DIAGNOSIS — M5440 Lumbago with sciatica, unspecified side: Secondary | ICD-10-CM | POA: Diagnosis not present

## 2016-04-19 DIAGNOSIS — Z5181 Encounter for therapeutic drug level monitoring: Secondary | ICD-10-CM | POA: Diagnosis not present

## 2016-05-01 DIAGNOSIS — E109 Type 1 diabetes mellitus without complications: Secondary | ICD-10-CM | POA: Diagnosis not present

## 2016-05-02 DIAGNOSIS — E109 Type 1 diabetes mellitus without complications: Secondary | ICD-10-CM | POA: Diagnosis not present

## 2016-05-07 DIAGNOSIS — Z85528 Personal history of other malignant neoplasm of kidney: Secondary | ICD-10-CM | POA: Diagnosis not present

## 2016-05-15 DIAGNOSIS — M5440 Lumbago with sciatica, unspecified side: Secondary | ICD-10-CM | POA: Diagnosis not present

## 2016-05-15 DIAGNOSIS — G894 Chronic pain syndrome: Secondary | ICD-10-CM | POA: Diagnosis not present

## 2016-05-15 DIAGNOSIS — I10 Essential (primary) hypertension: Secondary | ICD-10-CM | POA: Diagnosis not present

## 2016-05-15 DIAGNOSIS — Z5181 Encounter for therapeutic drug level monitoring: Secondary | ICD-10-CM | POA: Diagnosis not present

## 2016-05-15 DIAGNOSIS — G8929 Other chronic pain: Secondary | ICD-10-CM | POA: Diagnosis not present

## 2016-05-15 DIAGNOSIS — E1140 Type 2 diabetes mellitus with diabetic neuropathy, unspecified: Secondary | ICD-10-CM | POA: Diagnosis not present

## 2016-06-06 DIAGNOSIS — I10 Essential (primary) hypertension: Secondary | ICD-10-CM | POA: Diagnosis not present

## 2016-06-14 DIAGNOSIS — G894 Chronic pain syndrome: Secondary | ICD-10-CM | POA: Diagnosis not present

## 2016-06-14 DIAGNOSIS — Z5181 Encounter for therapeutic drug level monitoring: Secondary | ICD-10-CM | POA: Diagnosis not present

## 2016-06-14 DIAGNOSIS — E1140 Type 2 diabetes mellitus with diabetic neuropathy, unspecified: Secondary | ICD-10-CM | POA: Diagnosis not present

## 2016-06-14 DIAGNOSIS — I10 Essential (primary) hypertension: Secondary | ICD-10-CM | POA: Diagnosis not present

## 2016-06-14 DIAGNOSIS — M5440 Lumbago with sciatica, unspecified side: Secondary | ICD-10-CM | POA: Diagnosis not present

## 2016-07-12 DIAGNOSIS — G894 Chronic pain syndrome: Secondary | ICD-10-CM | POA: Diagnosis not present

## 2016-07-12 DIAGNOSIS — E1140 Type 2 diabetes mellitus with diabetic neuropathy, unspecified: Secondary | ICD-10-CM | POA: Diagnosis not present

## 2016-07-12 DIAGNOSIS — M5440 Lumbago with sciatica, unspecified side: Secondary | ICD-10-CM | POA: Diagnosis not present

## 2016-07-12 DIAGNOSIS — Z5181 Encounter for therapeutic drug level monitoring: Secondary | ICD-10-CM | POA: Diagnosis not present

## 2016-07-12 DIAGNOSIS — G8929 Other chronic pain: Secondary | ICD-10-CM | POA: Diagnosis not present

## 2016-07-12 DIAGNOSIS — I10 Essential (primary) hypertension: Secondary | ICD-10-CM | POA: Diagnosis not present

## 2016-07-12 DIAGNOSIS — Z79891 Long term (current) use of opiate analgesic: Secondary | ICD-10-CM | POA: Diagnosis not present

## 2016-08-02 DIAGNOSIS — Z794 Long term (current) use of insulin: Secondary | ICD-10-CM | POA: Diagnosis not present

## 2016-08-02 DIAGNOSIS — E119 Type 2 diabetes mellitus without complications: Secondary | ICD-10-CM | POA: Diagnosis not present

## 2016-08-12 DIAGNOSIS — E1140 Type 2 diabetes mellitus with diabetic neuropathy, unspecified: Secondary | ICD-10-CM | POA: Diagnosis not present

## 2016-08-12 DIAGNOSIS — I10 Essential (primary) hypertension: Secondary | ICD-10-CM | POA: Diagnosis not present

## 2016-08-12 DIAGNOSIS — Z5181 Encounter for therapeutic drug level monitoring: Secondary | ICD-10-CM | POA: Diagnosis not present

## 2016-08-12 DIAGNOSIS — G894 Chronic pain syndrome: Secondary | ICD-10-CM | POA: Diagnosis not present

## 2016-08-12 DIAGNOSIS — M5440 Lumbago with sciatica, unspecified side: Secondary | ICD-10-CM | POA: Diagnosis not present

## 2016-09-10 DIAGNOSIS — Z79899 Other long term (current) drug therapy: Secondary | ICD-10-CM | POA: Diagnosis not present

## 2016-09-10 DIAGNOSIS — Z5181 Encounter for therapeutic drug level monitoring: Secondary | ICD-10-CM | POA: Diagnosis not present

## 2016-09-10 DIAGNOSIS — G8929 Other chronic pain: Secondary | ICD-10-CM | POA: Diagnosis not present

## 2016-09-10 DIAGNOSIS — E1140 Type 2 diabetes mellitus with diabetic neuropathy, unspecified: Secondary | ICD-10-CM | POA: Diagnosis not present

## 2016-09-10 DIAGNOSIS — Z794 Long term (current) use of insulin: Secondary | ICD-10-CM | POA: Diagnosis not present

## 2016-09-19 DIAGNOSIS — Q231 Congenital insufficiency of aortic valve: Secondary | ICD-10-CM | POA: Diagnosis not present

## 2016-09-19 DIAGNOSIS — I10 Essential (primary) hypertension: Secondary | ICD-10-CM | POA: Diagnosis not present

## 2016-09-19 DIAGNOSIS — I2510 Atherosclerotic heart disease of native coronary artery without angina pectoris: Secondary | ICD-10-CM | POA: Diagnosis not present

## 2016-10-04 DIAGNOSIS — Z85528 Personal history of other malignant neoplasm of kidney: Secondary | ICD-10-CM | POA: Diagnosis not present

## 2016-10-04 DIAGNOSIS — N189 Chronic kidney disease, unspecified: Secondary | ICD-10-CM | POA: Diagnosis not present

## 2016-10-11 DIAGNOSIS — I10 Essential (primary) hypertension: Secondary | ICD-10-CM | POA: Diagnosis not present

## 2016-10-11 DIAGNOSIS — M5440 Lumbago with sciatica, unspecified side: Secondary | ICD-10-CM | POA: Diagnosis not present

## 2016-10-11 DIAGNOSIS — E1142 Type 2 diabetes mellitus with diabetic polyneuropathy: Secondary | ICD-10-CM | POA: Diagnosis not present

## 2016-10-11 DIAGNOSIS — Z5181 Encounter for therapeutic drug level monitoring: Secondary | ICD-10-CM | POA: Diagnosis not present

## 2016-10-11 DIAGNOSIS — G8929 Other chronic pain: Secondary | ICD-10-CM | POA: Diagnosis not present

## 2016-10-31 DIAGNOSIS — Z794 Long term (current) use of insulin: Secondary | ICD-10-CM | POA: Diagnosis not present

## 2016-10-31 DIAGNOSIS — E109 Type 1 diabetes mellitus without complications: Secondary | ICD-10-CM | POA: Diagnosis not present

## 2016-11-11 DIAGNOSIS — Z23 Encounter for immunization: Secondary | ICD-10-CM | POA: Diagnosis not present

## 2016-11-11 DIAGNOSIS — E1140 Type 2 diabetes mellitus with diabetic neuropathy, unspecified: Secondary | ICD-10-CM | POA: Diagnosis not present

## 2016-11-11 DIAGNOSIS — M5440 Lumbago with sciatica, unspecified side: Secondary | ICD-10-CM | POA: Diagnosis not present

## 2016-11-11 DIAGNOSIS — Z5181 Encounter for therapeutic drug level monitoring: Secondary | ICD-10-CM | POA: Diagnosis not present

## 2016-11-11 DIAGNOSIS — G8929 Other chronic pain: Secondary | ICD-10-CM | POA: Diagnosis not present

## 2016-11-27 DIAGNOSIS — Z794 Long term (current) use of insulin: Secondary | ICD-10-CM | POA: Diagnosis not present

## 2016-11-27 DIAGNOSIS — E1140 Type 2 diabetes mellitus with diabetic neuropathy, unspecified: Secondary | ICD-10-CM | POA: Diagnosis not present

## 2016-12-11 DIAGNOSIS — Z79899 Other long term (current) drug therapy: Secondary | ICD-10-CM | POA: Diagnosis not present

## 2016-12-11 DIAGNOSIS — G8929 Other chronic pain: Secondary | ICD-10-CM | POA: Diagnosis not present

## 2016-12-11 DIAGNOSIS — Z5181 Encounter for therapeutic drug level monitoring: Secondary | ICD-10-CM | POA: Diagnosis not present

## 2016-12-11 DIAGNOSIS — M5440 Lumbago with sciatica, unspecified side: Secondary | ICD-10-CM | POA: Diagnosis not present

## 2016-12-11 DIAGNOSIS — E1140 Type 2 diabetes mellitus with diabetic neuropathy, unspecified: Secondary | ICD-10-CM | POA: Diagnosis not present

## 2016-12-18 DIAGNOSIS — N401 Enlarged prostate with lower urinary tract symptoms: Secondary | ICD-10-CM | POA: Diagnosis not present

## 2016-12-18 DIAGNOSIS — Z1389 Encounter for screening for other disorder: Secondary | ICD-10-CM | POA: Diagnosis not present

## 2016-12-18 DIAGNOSIS — Z1212 Encounter for screening for malignant neoplasm of rectum: Secondary | ICD-10-CM | POA: Diagnosis not present

## 2016-12-18 DIAGNOSIS — E1140 Type 2 diabetes mellitus with diabetic neuropathy, unspecified: Secondary | ICD-10-CM | POA: Diagnosis not present

## 2016-12-18 DIAGNOSIS — Z794 Long term (current) use of insulin: Secondary | ICD-10-CM | POA: Diagnosis not present

## 2016-12-18 DIAGNOSIS — Z0000 Encounter for general adult medical examination without abnormal findings: Secondary | ICD-10-CM | POA: Diagnosis not present

## 2016-12-18 DIAGNOSIS — I10 Essential (primary) hypertension: Secondary | ICD-10-CM | POA: Diagnosis not present

## 2016-12-18 DIAGNOSIS — N138 Other obstructive and reflux uropathy: Secondary | ICD-10-CM | POA: Diagnosis not present

## 2017-01-01 DIAGNOSIS — Q231 Congenital insufficiency of aortic valve: Secondary | ICD-10-CM | POA: Diagnosis not present

## 2017-01-08 DIAGNOSIS — M5441 Lumbago with sciatica, right side: Secondary | ICD-10-CM | POA: Diagnosis not present

## 2017-01-08 DIAGNOSIS — E1140 Type 2 diabetes mellitus with diabetic neuropathy, unspecified: Secondary | ICD-10-CM | POA: Diagnosis not present

## 2017-01-08 DIAGNOSIS — I10 Essential (primary) hypertension: Secondary | ICD-10-CM | POA: Diagnosis not present

## 2017-01-08 DIAGNOSIS — G8929 Other chronic pain: Secondary | ICD-10-CM | POA: Diagnosis not present

## 2017-01-08 DIAGNOSIS — Z5181 Encounter for therapeutic drug level monitoring: Secondary | ICD-10-CM | POA: Diagnosis not present

## 2017-01-20 DIAGNOSIS — E119 Type 2 diabetes mellitus without complications: Secondary | ICD-10-CM | POA: Diagnosis not present

## 2017-01-20 DIAGNOSIS — J209 Acute bronchitis, unspecified: Secondary | ICD-10-CM | POA: Diagnosis not present

## 2017-01-23 DIAGNOSIS — Z794 Long term (current) use of insulin: Secondary | ICD-10-CM | POA: Diagnosis not present

## 2017-01-30 DIAGNOSIS — Z794 Long term (current) use of insulin: Secondary | ICD-10-CM | POA: Diagnosis not present

## 2017-01-30 DIAGNOSIS — E109 Type 1 diabetes mellitus without complications: Secondary | ICD-10-CM | POA: Diagnosis not present

## 2017-02-07 DIAGNOSIS — S81802A Unspecified open wound, left lower leg, initial encounter: Secondary | ICD-10-CM | POA: Diagnosis not present

## 2017-02-07 DIAGNOSIS — Z5181 Encounter for therapeutic drug level monitoring: Secondary | ICD-10-CM | POA: Diagnosis not present

## 2017-02-07 DIAGNOSIS — G8929 Other chronic pain: Secondary | ICD-10-CM | POA: Diagnosis not present

## 2017-02-07 DIAGNOSIS — M546 Pain in thoracic spine: Secondary | ICD-10-CM | POA: Diagnosis not present

## 2017-02-26 DIAGNOSIS — E1165 Type 2 diabetes mellitus with hyperglycemia: Secondary | ICD-10-CM | POA: Diagnosis not present

## 2017-02-26 DIAGNOSIS — I872 Venous insufficiency (chronic) (peripheral): Secondary | ICD-10-CM | POA: Diagnosis not present

## 2017-02-27 DIAGNOSIS — I872 Venous insufficiency (chronic) (peripheral): Secondary | ICD-10-CM | POA: Diagnosis not present

## 2017-02-27 DIAGNOSIS — E1122 Type 2 diabetes mellitus with diabetic chronic kidney disease: Secondary | ICD-10-CM | POA: Diagnosis not present

## 2017-02-27 DIAGNOSIS — E1165 Type 2 diabetes mellitus with hyperglycemia: Secondary | ICD-10-CM | POA: Diagnosis not present

## 2017-02-27 DIAGNOSIS — E1140 Type 2 diabetes mellitus with diabetic neuropathy, unspecified: Secondary | ICD-10-CM | POA: Diagnosis not present

## 2017-02-27 DIAGNOSIS — N183 Chronic kidney disease, stage 3 (moderate): Secondary | ICD-10-CM | POA: Diagnosis not present

## 2017-02-27 DIAGNOSIS — I129 Hypertensive chronic kidney disease with stage 1 through stage 4 chronic kidney disease, or unspecified chronic kidney disease: Secondary | ICD-10-CM | POA: Diagnosis not present

## 2017-03-02 DIAGNOSIS — I872 Venous insufficiency (chronic) (peripheral): Secondary | ICD-10-CM | POA: Diagnosis not present

## 2017-03-02 DIAGNOSIS — E1165 Type 2 diabetes mellitus with hyperglycemia: Secondary | ICD-10-CM | POA: Diagnosis not present

## 2017-03-02 DIAGNOSIS — E1140 Type 2 diabetes mellitus with diabetic neuropathy, unspecified: Secondary | ICD-10-CM | POA: Diagnosis not present

## 2017-03-02 DIAGNOSIS — N183 Chronic kidney disease, stage 3 (moderate): Secondary | ICD-10-CM | POA: Diagnosis not present

## 2017-03-02 DIAGNOSIS — E1122 Type 2 diabetes mellitus with diabetic chronic kidney disease: Secondary | ICD-10-CM | POA: Diagnosis not present

## 2017-03-02 DIAGNOSIS — I129 Hypertensive chronic kidney disease with stage 1 through stage 4 chronic kidney disease, or unspecified chronic kidney disease: Secondary | ICD-10-CM | POA: Diagnosis not present

## 2017-03-05 DIAGNOSIS — E1165 Type 2 diabetes mellitus with hyperglycemia: Secondary | ICD-10-CM | POA: Diagnosis not present

## 2017-03-05 DIAGNOSIS — N183 Chronic kidney disease, stage 3 (moderate): Secondary | ICD-10-CM | POA: Diagnosis not present

## 2017-03-05 DIAGNOSIS — E1140 Type 2 diabetes mellitus with diabetic neuropathy, unspecified: Secondary | ICD-10-CM | POA: Diagnosis not present

## 2017-03-05 DIAGNOSIS — I872 Venous insufficiency (chronic) (peripheral): Secondary | ICD-10-CM | POA: Diagnosis not present

## 2017-03-05 DIAGNOSIS — I129 Hypertensive chronic kidney disease with stage 1 through stage 4 chronic kidney disease, or unspecified chronic kidney disease: Secondary | ICD-10-CM | POA: Diagnosis not present

## 2017-03-05 DIAGNOSIS — E1122 Type 2 diabetes mellitus with diabetic chronic kidney disease: Secondary | ICD-10-CM | POA: Diagnosis not present

## 2017-03-08 DIAGNOSIS — E1140 Type 2 diabetes mellitus with diabetic neuropathy, unspecified: Secondary | ICD-10-CM | POA: Diagnosis not present

## 2017-03-08 DIAGNOSIS — I129 Hypertensive chronic kidney disease with stage 1 through stage 4 chronic kidney disease, or unspecified chronic kidney disease: Secondary | ICD-10-CM | POA: Diagnosis not present

## 2017-03-08 DIAGNOSIS — N183 Chronic kidney disease, stage 3 (moderate): Secondary | ICD-10-CM | POA: Diagnosis not present

## 2017-03-08 DIAGNOSIS — I872 Venous insufficiency (chronic) (peripheral): Secondary | ICD-10-CM | POA: Diagnosis not present

## 2017-03-08 DIAGNOSIS — E1165 Type 2 diabetes mellitus with hyperglycemia: Secondary | ICD-10-CM | POA: Diagnosis not present

## 2017-03-08 DIAGNOSIS — E1122 Type 2 diabetes mellitus with diabetic chronic kidney disease: Secondary | ICD-10-CM | POA: Diagnosis not present

## 2017-03-10 DIAGNOSIS — E1140 Type 2 diabetes mellitus with diabetic neuropathy, unspecified: Secondary | ICD-10-CM | POA: Diagnosis not present

## 2017-03-10 DIAGNOSIS — I10 Essential (primary) hypertension: Secondary | ICD-10-CM | POA: Diagnosis not present

## 2017-03-10 DIAGNOSIS — M5441 Lumbago with sciatica, right side: Secondary | ICD-10-CM | POA: Diagnosis not present

## 2017-03-10 DIAGNOSIS — G8929 Other chronic pain: Secondary | ICD-10-CM | POA: Diagnosis not present

## 2017-03-11 DIAGNOSIS — E1165 Type 2 diabetes mellitus with hyperglycemia: Secondary | ICD-10-CM | POA: Diagnosis not present

## 2017-03-11 DIAGNOSIS — E1122 Type 2 diabetes mellitus with diabetic chronic kidney disease: Secondary | ICD-10-CM | POA: Diagnosis not present

## 2017-03-11 DIAGNOSIS — E1140 Type 2 diabetes mellitus with diabetic neuropathy, unspecified: Secondary | ICD-10-CM | POA: Diagnosis not present

## 2017-03-11 DIAGNOSIS — I872 Venous insufficiency (chronic) (peripheral): Secondary | ICD-10-CM | POA: Diagnosis not present

## 2017-03-11 DIAGNOSIS — I129 Hypertensive chronic kidney disease with stage 1 through stage 4 chronic kidney disease, or unspecified chronic kidney disease: Secondary | ICD-10-CM | POA: Diagnosis not present

## 2017-03-11 DIAGNOSIS — N183 Chronic kidney disease, stage 3 (moderate): Secondary | ICD-10-CM | POA: Diagnosis not present

## 2017-03-14 DIAGNOSIS — N183 Chronic kidney disease, stage 3 (moderate): Secondary | ICD-10-CM | POA: Diagnosis not present

## 2017-03-14 DIAGNOSIS — E1122 Type 2 diabetes mellitus with diabetic chronic kidney disease: Secondary | ICD-10-CM | POA: Diagnosis not present

## 2017-03-14 DIAGNOSIS — E1165 Type 2 diabetes mellitus with hyperglycemia: Secondary | ICD-10-CM | POA: Diagnosis not present

## 2017-03-14 DIAGNOSIS — I872 Venous insufficiency (chronic) (peripheral): Secondary | ICD-10-CM | POA: Diagnosis not present

## 2017-03-14 DIAGNOSIS — I129 Hypertensive chronic kidney disease with stage 1 through stage 4 chronic kidney disease, or unspecified chronic kidney disease: Secondary | ICD-10-CM | POA: Diagnosis not present

## 2017-03-14 DIAGNOSIS — E1140 Type 2 diabetes mellitus with diabetic neuropathy, unspecified: Secondary | ICD-10-CM | POA: Diagnosis not present

## 2017-03-21 DIAGNOSIS — C642 Malignant neoplasm of left kidney, except renal pelvis: Secondary | ICD-10-CM | POA: Diagnosis not present

## 2017-03-21 DIAGNOSIS — I700 Atherosclerosis of aorta: Secondary | ICD-10-CM | POA: Diagnosis not present

## 2017-03-31 DIAGNOSIS — Z794 Long term (current) use of insulin: Secondary | ICD-10-CM | POA: Diagnosis not present

## 2017-03-31 DIAGNOSIS — E1165 Type 2 diabetes mellitus with hyperglycemia: Secondary | ICD-10-CM | POA: Diagnosis not present

## 2017-04-02 DIAGNOSIS — N189 Chronic kidney disease, unspecified: Secondary | ICD-10-CM | POA: Diagnosis not present

## 2017-04-02 DIAGNOSIS — Z85528 Personal history of other malignant neoplasm of kidney: Secondary | ICD-10-CM | POA: Diagnosis not present

## 2017-04-04 DIAGNOSIS — L03116 Cellulitis of left lower limb: Secondary | ICD-10-CM | POA: Diagnosis not present

## 2017-04-05 DIAGNOSIS — E1122 Type 2 diabetes mellitus with diabetic chronic kidney disease: Secondary | ICD-10-CM | POA: Diagnosis not present

## 2017-04-05 DIAGNOSIS — E1140 Type 2 diabetes mellitus with diabetic neuropathy, unspecified: Secondary | ICD-10-CM | POA: Diagnosis not present

## 2017-04-05 DIAGNOSIS — I129 Hypertensive chronic kidney disease with stage 1 through stage 4 chronic kidney disease, or unspecified chronic kidney disease: Secondary | ICD-10-CM | POA: Diagnosis not present

## 2017-04-05 DIAGNOSIS — N183 Chronic kidney disease, stage 3 (moderate): Secondary | ICD-10-CM | POA: Diagnosis not present

## 2017-04-05 DIAGNOSIS — L03116 Cellulitis of left lower limb: Secondary | ICD-10-CM | POA: Diagnosis not present

## 2017-04-05 DIAGNOSIS — I872 Venous insufficiency (chronic) (peripheral): Secondary | ICD-10-CM | POA: Diagnosis not present

## 2017-04-07 DIAGNOSIS — I129 Hypertensive chronic kidney disease with stage 1 through stage 4 chronic kidney disease, or unspecified chronic kidney disease: Secondary | ICD-10-CM | POA: Diagnosis not present

## 2017-04-07 DIAGNOSIS — E1140 Type 2 diabetes mellitus with diabetic neuropathy, unspecified: Secondary | ICD-10-CM | POA: Diagnosis not present

## 2017-04-07 DIAGNOSIS — L03116 Cellulitis of left lower limb: Secondary | ICD-10-CM | POA: Diagnosis not present

## 2017-04-07 DIAGNOSIS — E1122 Type 2 diabetes mellitus with diabetic chronic kidney disease: Secondary | ICD-10-CM | POA: Diagnosis not present

## 2017-04-07 DIAGNOSIS — I872 Venous insufficiency (chronic) (peripheral): Secondary | ICD-10-CM | POA: Diagnosis not present

## 2017-04-07 DIAGNOSIS — N183 Chronic kidney disease, stage 3 (moderate): Secondary | ICD-10-CM | POA: Diagnosis not present

## 2017-04-09 DIAGNOSIS — L03116 Cellulitis of left lower limb: Secondary | ICD-10-CM | POA: Diagnosis not present

## 2017-04-09 DIAGNOSIS — E1140 Type 2 diabetes mellitus with diabetic neuropathy, unspecified: Secondary | ICD-10-CM | POA: Diagnosis not present

## 2017-04-09 DIAGNOSIS — N183 Chronic kidney disease, stage 3 (moderate): Secondary | ICD-10-CM | POA: Diagnosis not present

## 2017-04-09 DIAGNOSIS — E1122 Type 2 diabetes mellitus with diabetic chronic kidney disease: Secondary | ICD-10-CM | POA: Diagnosis not present

## 2017-04-09 DIAGNOSIS — I872 Venous insufficiency (chronic) (peripheral): Secondary | ICD-10-CM | POA: Diagnosis not present

## 2017-04-09 DIAGNOSIS — I129 Hypertensive chronic kidney disease with stage 1 through stage 4 chronic kidney disease, or unspecified chronic kidney disease: Secondary | ICD-10-CM | POA: Diagnosis not present

## 2017-04-11 DIAGNOSIS — E1140 Type 2 diabetes mellitus with diabetic neuropathy, unspecified: Secondary | ICD-10-CM | POA: Diagnosis not present

## 2017-04-11 DIAGNOSIS — I872 Venous insufficiency (chronic) (peripheral): Secondary | ICD-10-CM | POA: Diagnosis not present

## 2017-04-11 DIAGNOSIS — E1122 Type 2 diabetes mellitus with diabetic chronic kidney disease: Secondary | ICD-10-CM | POA: Diagnosis not present

## 2017-04-11 DIAGNOSIS — I10 Essential (primary) hypertension: Secondary | ICD-10-CM | POA: Diagnosis not present

## 2017-04-11 DIAGNOSIS — N183 Chronic kidney disease, stage 3 (moderate): Secondary | ICD-10-CM | POA: Diagnosis not present

## 2017-04-11 DIAGNOSIS — L03116 Cellulitis of left lower limb: Secondary | ICD-10-CM | POA: Diagnosis not present

## 2017-04-11 DIAGNOSIS — G8929 Other chronic pain: Secondary | ICD-10-CM | POA: Diagnosis not present

## 2017-04-11 DIAGNOSIS — I129 Hypertensive chronic kidney disease with stage 1 through stage 4 chronic kidney disease, or unspecified chronic kidney disease: Secondary | ICD-10-CM | POA: Diagnosis not present

## 2017-04-11 DIAGNOSIS — M5441 Lumbago with sciatica, right side: Secondary | ICD-10-CM | POA: Diagnosis not present

## 2017-04-11 DIAGNOSIS — Z5181 Encounter for therapeutic drug level monitoring: Secondary | ICD-10-CM | POA: Diagnosis not present

## 2017-04-14 DIAGNOSIS — I872 Venous insufficiency (chronic) (peripheral): Secondary | ICD-10-CM | POA: Diagnosis not present

## 2017-04-14 DIAGNOSIS — N183 Chronic kidney disease, stage 3 (moderate): Secondary | ICD-10-CM | POA: Diagnosis not present

## 2017-04-14 DIAGNOSIS — E1140 Type 2 diabetes mellitus with diabetic neuropathy, unspecified: Secondary | ICD-10-CM | POA: Diagnosis not present

## 2017-04-14 DIAGNOSIS — L03116 Cellulitis of left lower limb: Secondary | ICD-10-CM | POA: Diagnosis not present

## 2017-04-14 DIAGNOSIS — I129 Hypertensive chronic kidney disease with stage 1 through stage 4 chronic kidney disease, or unspecified chronic kidney disease: Secondary | ICD-10-CM | POA: Diagnosis not present

## 2017-04-14 DIAGNOSIS — E1122 Type 2 diabetes mellitus with diabetic chronic kidney disease: Secondary | ICD-10-CM | POA: Diagnosis not present

## 2017-04-16 DIAGNOSIS — L03116 Cellulitis of left lower limb: Secondary | ICD-10-CM | POA: Diagnosis not present

## 2017-04-16 DIAGNOSIS — I872 Venous insufficiency (chronic) (peripheral): Secondary | ICD-10-CM | POA: Diagnosis not present

## 2017-04-16 DIAGNOSIS — I129 Hypertensive chronic kidney disease with stage 1 through stage 4 chronic kidney disease, or unspecified chronic kidney disease: Secondary | ICD-10-CM | POA: Diagnosis not present

## 2017-04-16 DIAGNOSIS — N183 Chronic kidney disease, stage 3 (moderate): Secondary | ICD-10-CM | POA: Diagnosis not present

## 2017-04-16 DIAGNOSIS — E1140 Type 2 diabetes mellitus with diabetic neuropathy, unspecified: Secondary | ICD-10-CM | POA: Diagnosis not present

## 2017-04-16 DIAGNOSIS — E1122 Type 2 diabetes mellitus with diabetic chronic kidney disease: Secondary | ICD-10-CM | POA: Diagnosis not present

## 2017-04-18 DIAGNOSIS — E1122 Type 2 diabetes mellitus with diabetic chronic kidney disease: Secondary | ICD-10-CM | POA: Diagnosis not present

## 2017-04-18 DIAGNOSIS — L03116 Cellulitis of left lower limb: Secondary | ICD-10-CM | POA: Diagnosis not present

## 2017-04-18 DIAGNOSIS — E1140 Type 2 diabetes mellitus with diabetic neuropathy, unspecified: Secondary | ICD-10-CM | POA: Diagnosis not present

## 2017-04-18 DIAGNOSIS — I129 Hypertensive chronic kidney disease with stage 1 through stage 4 chronic kidney disease, or unspecified chronic kidney disease: Secondary | ICD-10-CM | POA: Diagnosis not present

## 2017-04-18 DIAGNOSIS — I872 Venous insufficiency (chronic) (peripheral): Secondary | ICD-10-CM | POA: Diagnosis not present

## 2017-04-18 DIAGNOSIS — N183 Chronic kidney disease, stage 3 (moderate): Secondary | ICD-10-CM | POA: Diagnosis not present

## 2017-04-21 DIAGNOSIS — I129 Hypertensive chronic kidney disease with stage 1 through stage 4 chronic kidney disease, or unspecified chronic kidney disease: Secondary | ICD-10-CM | POA: Diagnosis not present

## 2017-04-21 DIAGNOSIS — N183 Chronic kidney disease, stage 3 (moderate): Secondary | ICD-10-CM | POA: Diagnosis not present

## 2017-04-21 DIAGNOSIS — E1140 Type 2 diabetes mellitus with diabetic neuropathy, unspecified: Secondary | ICD-10-CM | POA: Diagnosis not present

## 2017-04-21 DIAGNOSIS — L03116 Cellulitis of left lower limb: Secondary | ICD-10-CM | POA: Diagnosis not present

## 2017-04-21 DIAGNOSIS — I872 Venous insufficiency (chronic) (peripheral): Secondary | ICD-10-CM | POA: Diagnosis not present

## 2017-04-21 DIAGNOSIS — E1122 Type 2 diabetes mellitus with diabetic chronic kidney disease: Secondary | ICD-10-CM | POA: Diagnosis not present

## 2017-05-07 DIAGNOSIS — Z794 Long term (current) use of insulin: Secondary | ICD-10-CM | POA: Diagnosis not present

## 2017-05-07 DIAGNOSIS — E1140 Type 2 diabetes mellitus with diabetic neuropathy, unspecified: Secondary | ICD-10-CM | POA: Diagnosis not present

## 2017-05-09 DIAGNOSIS — Z5181 Encounter for therapeutic drug level monitoring: Secondary | ICD-10-CM | POA: Diagnosis not present

## 2017-05-09 DIAGNOSIS — G8929 Other chronic pain: Secondary | ICD-10-CM | POA: Diagnosis not present

## 2017-05-09 DIAGNOSIS — I10 Essential (primary) hypertension: Secondary | ICD-10-CM | POA: Diagnosis not present

## 2017-05-09 DIAGNOSIS — L03116 Cellulitis of left lower limb: Secondary | ICD-10-CM | POA: Diagnosis not present

## 2017-05-09 DIAGNOSIS — E1140 Type 2 diabetes mellitus with diabetic neuropathy, unspecified: Secondary | ICD-10-CM | POA: Diagnosis not present

## 2017-05-09 DIAGNOSIS — M5441 Lumbago with sciatica, right side: Secondary | ICD-10-CM | POA: Diagnosis not present

## 2017-06-10 DIAGNOSIS — M5441 Lumbago with sciatica, right side: Secondary | ICD-10-CM | POA: Diagnosis not present

## 2017-06-10 DIAGNOSIS — E1140 Type 2 diabetes mellitus with diabetic neuropathy, unspecified: Secondary | ICD-10-CM | POA: Diagnosis not present

## 2017-06-10 DIAGNOSIS — Z23 Encounter for immunization: Secondary | ICD-10-CM | POA: Diagnosis not present

## 2017-06-10 DIAGNOSIS — G8929 Other chronic pain: Secondary | ICD-10-CM | POA: Diagnosis not present

## 2017-06-10 DIAGNOSIS — S81802A Unspecified open wound, left lower leg, initial encounter: Secondary | ICD-10-CM | POA: Diagnosis not present

## 2017-06-10 DIAGNOSIS — Z5181 Encounter for therapeutic drug level monitoring: Secondary | ICD-10-CM | POA: Diagnosis not present

## 2017-06-10 DIAGNOSIS — I10 Essential (primary) hypertension: Secondary | ICD-10-CM | POA: Diagnosis not present

## 2017-06-30 DIAGNOSIS — M542 Cervicalgia: Secondary | ICD-10-CM | POA: Diagnosis not present

## 2017-06-30 DIAGNOSIS — Z794 Long term (current) use of insulin: Secondary | ICD-10-CM | POA: Diagnosis not present

## 2017-06-30 DIAGNOSIS — E1140 Type 2 diabetes mellitus with diabetic neuropathy, unspecified: Secondary | ICD-10-CM | POA: Diagnosis not present

## 2017-07-01 DIAGNOSIS — E113293 Type 2 diabetes mellitus with mild nonproliferative diabetic retinopathy without macular edema, bilateral: Secondary | ICD-10-CM | POA: Diagnosis not present

## 2017-07-01 DIAGNOSIS — H2513 Age-related nuclear cataract, bilateral: Secondary | ICD-10-CM | POA: Diagnosis not present

## 2017-07-11 DIAGNOSIS — I10 Essential (primary) hypertension: Secondary | ICD-10-CM | POA: Diagnosis not present

## 2017-07-11 DIAGNOSIS — Z5181 Encounter for therapeutic drug level monitoring: Secondary | ICD-10-CM | POA: Diagnosis not present

## 2017-07-11 DIAGNOSIS — M5441 Lumbago with sciatica, right side: Secondary | ICD-10-CM | POA: Diagnosis not present

## 2017-07-11 DIAGNOSIS — G8929 Other chronic pain: Secondary | ICD-10-CM | POA: Diagnosis not present

## 2017-07-11 DIAGNOSIS — E1140 Type 2 diabetes mellitus with diabetic neuropathy, unspecified: Secondary | ICD-10-CM | POA: Diagnosis not present

## 2017-08-07 DIAGNOSIS — Z794 Long term (current) use of insulin: Secondary | ICD-10-CM | POA: Diagnosis not present

## 2017-08-11 DIAGNOSIS — E1142 Type 2 diabetes mellitus with diabetic polyneuropathy: Secondary | ICD-10-CM | POA: Diagnosis not present

## 2017-08-11 DIAGNOSIS — Z79899 Other long term (current) drug therapy: Secondary | ICD-10-CM | POA: Diagnosis not present

## 2017-08-11 DIAGNOSIS — R4020 Unspecified coma: Secondary | ICD-10-CM | POA: Diagnosis not present

## 2017-08-11 DIAGNOSIS — I491 Atrial premature depolarization: Secondary | ICD-10-CM | POA: Diagnosis not present

## 2017-08-11 DIAGNOSIS — I4891 Unspecified atrial fibrillation: Secondary | ICD-10-CM | POA: Diagnosis not present

## 2017-08-11 DIAGNOSIS — G8929 Other chronic pain: Secondary | ICD-10-CM | POA: Diagnosis not present

## 2017-08-11 DIAGNOSIS — Z5181 Encounter for therapeutic drug level monitoring: Secondary | ICD-10-CM | POA: Diagnosis not present

## 2017-08-11 DIAGNOSIS — I110 Hypertensive heart disease with heart failure: Secondary | ICD-10-CM | POA: Diagnosis not present

## 2017-08-11 DIAGNOSIS — R402441 Other coma, without documented Glasgow coma scale score, or with partial score reported, in the field [EMT or ambulance]: Secondary | ICD-10-CM | POA: Diagnosis not present

## 2017-08-11 DIAGNOSIS — M5441 Lumbago with sciatica, right side: Secondary | ICD-10-CM | POA: Diagnosis not present

## 2017-08-11 DIAGNOSIS — I509 Heart failure, unspecified: Secondary | ICD-10-CM | POA: Diagnosis not present

## 2017-08-11 DIAGNOSIS — R0689 Other abnormalities of breathing: Secondary | ICD-10-CM | POA: Diagnosis not present

## 2017-08-11 DIAGNOSIS — I2510 Atherosclerotic heart disease of native coronary artery without angina pectoris: Secondary | ICD-10-CM | POA: Diagnosis not present

## 2017-09-09 DIAGNOSIS — Z5181 Encounter for therapeutic drug level monitoring: Secondary | ICD-10-CM | POA: Diagnosis not present

## 2017-09-09 DIAGNOSIS — M5442 Lumbago with sciatica, left side: Secondary | ICD-10-CM | POA: Diagnosis not present

## 2017-09-09 DIAGNOSIS — E1142 Type 2 diabetes mellitus with diabetic polyneuropathy: Secondary | ICD-10-CM | POA: Diagnosis not present

## 2017-09-09 DIAGNOSIS — M5441 Lumbago with sciatica, right side: Secondary | ICD-10-CM | POA: Diagnosis not present

## 2017-09-09 DIAGNOSIS — G8929 Other chronic pain: Secondary | ICD-10-CM | POA: Diagnosis not present

## 2017-09-09 DIAGNOSIS — I10 Essential (primary) hypertension: Secondary | ICD-10-CM | POA: Diagnosis not present

## 2017-10-10 DIAGNOSIS — M5441 Lumbago with sciatica, right side: Secondary | ICD-10-CM | POA: Diagnosis not present

## 2017-10-10 DIAGNOSIS — I10 Essential (primary) hypertension: Secondary | ICD-10-CM | POA: Diagnosis not present

## 2017-10-10 DIAGNOSIS — N179 Acute kidney failure, unspecified: Secondary | ICD-10-CM | POA: Diagnosis not present

## 2017-10-10 DIAGNOSIS — Z5181 Encounter for therapeutic drug level monitoring: Secondary | ICD-10-CM | POA: Diagnosis not present

## 2017-10-10 DIAGNOSIS — G8929 Other chronic pain: Secondary | ICD-10-CM | POA: Diagnosis not present

## 2017-10-14 DIAGNOSIS — N189 Chronic kidney disease, unspecified: Secondary | ICD-10-CM | POA: Diagnosis not present

## 2017-10-14 DIAGNOSIS — R739 Hyperglycemia, unspecified: Secondary | ICD-10-CM | POA: Diagnosis not present

## 2017-10-14 DIAGNOSIS — Z85528 Personal history of other malignant neoplasm of kidney: Secondary | ICD-10-CM | POA: Diagnosis not present

## 2017-11-07 DIAGNOSIS — E109 Type 1 diabetes mellitus without complications: Secondary | ICD-10-CM | POA: Diagnosis not present

## 2017-11-07 DIAGNOSIS — Z794 Long term (current) use of insulin: Secondary | ICD-10-CM | POA: Diagnosis not present

## 2017-11-10 DIAGNOSIS — Z794 Long term (current) use of insulin: Secondary | ICD-10-CM | POA: Diagnosis not present

## 2017-11-10 DIAGNOSIS — E1142 Type 2 diabetes mellitus with diabetic polyneuropathy: Secondary | ICD-10-CM | POA: Diagnosis not present

## 2017-11-10 DIAGNOSIS — M5441 Lumbago with sciatica, right side: Secondary | ICD-10-CM | POA: Diagnosis not present

## 2017-11-10 DIAGNOSIS — M5442 Lumbago with sciatica, left side: Secondary | ICD-10-CM | POA: Diagnosis not present

## 2017-11-10 DIAGNOSIS — Z5181 Encounter for therapeutic drug level monitoring: Secondary | ICD-10-CM | POA: Diagnosis not present

## 2017-11-10 DIAGNOSIS — G8929 Other chronic pain: Secondary | ICD-10-CM | POA: Diagnosis not present

## 2017-11-10 DIAGNOSIS — E109 Type 1 diabetes mellitus without complications: Secondary | ICD-10-CM | POA: Diagnosis not present

## 2017-12-10 DIAGNOSIS — M5442 Lumbago with sciatica, left side: Secondary | ICD-10-CM | POA: Diagnosis not present

## 2017-12-10 DIAGNOSIS — I10 Essential (primary) hypertension: Secondary | ICD-10-CM | POA: Diagnosis not present

## 2017-12-10 DIAGNOSIS — Z5181 Encounter for therapeutic drug level monitoring: Secondary | ICD-10-CM | POA: Diagnosis not present

## 2017-12-10 DIAGNOSIS — G8929 Other chronic pain: Secondary | ICD-10-CM | POA: Diagnosis not present

## 2017-12-10 DIAGNOSIS — M5441 Lumbago with sciatica, right side: Secondary | ICD-10-CM | POA: Diagnosis not present

## 2017-12-10 DIAGNOSIS — Z23 Encounter for immunization: Secondary | ICD-10-CM | POA: Diagnosis not present

## 2017-12-10 DIAGNOSIS — E1142 Type 2 diabetes mellitus with diabetic polyneuropathy: Secondary | ICD-10-CM | POA: Diagnosis not present

## 2017-12-31 ENCOUNTER — Other Ambulatory Visit: Payer: Self-pay | Admitting: *Deleted

## 2017-12-31 ENCOUNTER — Encounter: Payer: Self-pay | Admitting: *Deleted

## 2018-01-02 ENCOUNTER — Ambulatory Visit: Payer: Medicare Other | Admitting: Cardiology

## 2018-01-07 DIAGNOSIS — E782 Mixed hyperlipidemia: Secondary | ICD-10-CM | POA: Diagnosis not present

## 2018-01-07 DIAGNOSIS — E1169 Type 2 diabetes mellitus with other specified complication: Secondary | ICD-10-CM | POA: Diagnosis not present

## 2018-01-07 DIAGNOSIS — Z0000 Encounter for general adult medical examination without abnormal findings: Secondary | ICD-10-CM | POA: Diagnosis not present

## 2018-01-07 DIAGNOSIS — I10 Essential (primary) hypertension: Secondary | ICD-10-CM | POA: Diagnosis not present

## 2018-01-07 DIAGNOSIS — Z1389 Encounter for screening for other disorder: Secondary | ICD-10-CM | POA: Diagnosis not present

## 2018-01-09 DIAGNOSIS — G8929 Other chronic pain: Secondary | ICD-10-CM | POA: Diagnosis not present

## 2018-01-09 DIAGNOSIS — Z5181 Encounter for therapeutic drug level monitoring: Secondary | ICD-10-CM | POA: Diagnosis not present

## 2018-01-09 DIAGNOSIS — Z79899 Other long term (current) drug therapy: Secondary | ICD-10-CM | POA: Diagnosis not present

## 2018-01-09 DIAGNOSIS — M5441 Lumbago with sciatica, right side: Secondary | ICD-10-CM | POA: Diagnosis not present

## 2018-02-09 DIAGNOSIS — G8929 Other chronic pain: Secondary | ICD-10-CM | POA: Diagnosis not present

## 2018-02-09 DIAGNOSIS — Z5181 Encounter for therapeutic drug level monitoring: Secondary | ICD-10-CM | POA: Diagnosis not present

## 2018-02-09 DIAGNOSIS — E1169 Type 2 diabetes mellitus with other specified complication: Secondary | ICD-10-CM | POA: Diagnosis not present

## 2018-02-09 DIAGNOSIS — M5441 Lumbago with sciatica, right side: Secondary | ICD-10-CM | POA: Diagnosis not present

## 2018-02-09 DIAGNOSIS — Z794 Long term (current) use of insulin: Secondary | ICD-10-CM | POA: Diagnosis not present

## 2018-02-09 DIAGNOSIS — E782 Mixed hyperlipidemia: Secondary | ICD-10-CM | POA: Diagnosis not present

## 2018-02-09 DIAGNOSIS — E109 Type 1 diabetes mellitus without complications: Secondary | ICD-10-CM | POA: Diagnosis not present

## 2018-02-25 ENCOUNTER — Encounter: Payer: Self-pay | Admitting: Cardiology

## 2018-02-25 ENCOUNTER — Ambulatory Visit: Payer: Medicare Other | Admitting: Cardiology

## 2018-02-25 VITALS — BP 116/64 | HR 77 | Ht 67.0 in | Wt 294.8 lb

## 2018-02-25 DIAGNOSIS — Q231 Congenital insufficiency of aortic valve: Secondary | ICD-10-CM | POA: Diagnosis not present

## 2018-02-25 DIAGNOSIS — I2510 Atherosclerotic heart disease of native coronary artery without angina pectoris: Secondary | ICD-10-CM

## 2018-02-25 DIAGNOSIS — E7800 Pure hypercholesterolemia, unspecified: Secondary | ICD-10-CM

## 2018-02-25 DIAGNOSIS — I350 Nonrheumatic aortic (valve) stenosis: Secondary | ICD-10-CM

## 2018-02-25 DIAGNOSIS — R002 Palpitations: Secondary | ICD-10-CM

## 2018-02-25 NOTE — Progress Notes (Signed)
Cardiology Office Note:    Date:  02/25/2018   ID:  John Tucker, DOB [birth year], MRN [id number]  PCP:  Garwin Brothers, MD  Cardiologist:  Jenne Campus, MD    Referring MD: No ref. provider found   Chief Complaint  Patient presents with  . Follow-up  Doing fine cardiac wise but do have a chronic problem with the back  History of Present Illness:    John Tucker is a 78 y.o. male with bicuspid aortic valve aortic stenosis last evaluation more than year ago.  Cardiac wise appears to be doing well however his ability to exercise is very limited because of chronic back pain as well as chronic joint aches.  Denies have any chest pain tightness squeezing pressure branches he does have shortness of breath there is no dizziness or passing out  Past Medical History:  Diagnosis Date  . Acute respiratory failure with hypoxia and hypercarbia ([place]) 08/24/2014  . Back pain   . Bicuspid aortic valve 09/20/2014  . Cancer ([place])   . Coronary artery disease involving native coronary artery of native heart without angina pectoris 06/14/2015   Calcification of coronary arteries on CT  . Diabetes mellitus without complication ([place])   . Hypertension   . Malignant essential hypertension 09/20/2014  . Obesity 09/20/2014  . Palpitations 09/20/2014  . Pre-operative cardiovascular examination 09/20/2014  . Premature atrial complex 09/20/2014  . Pure hypercholesterolemia 09/20/2014  . Renal disorder   . Spinal stenosis, thoracic     Past Surgical History:  Procedure Laterality Date  . HERNIA REPAIR    . NEPHRECTOMY    . ROTATOR CUFF REPAIR      Current Medications: Current Meds  Medication Sig  . aspirin 325 MG tablet Take 325 mg by mouth daily.  . CRESTOR 10 MG tablet Take 10 mg by mouth daily.  . furosemide (LASIX) 80 MG tablet Take 1 tablet by mouth daily.  Marland Kitchen glimepiride (AMARYL) 4 MG tablet Take 4 mg by mouth daily.  . metoprolol succinate (TOPROL-XL) 100 MG 24 hr tablet Take 100 mg by mouth daily.  Marland Kitchen  oxyCODONE (OXYCONTIN) 10 mg 12 hr tablet Take by mouth.  Marland Kitchen oxymorphone (OPANA ER) 20 MG 12 hr tablet Take 1 tablet by mouth 2 (two) times daily.  . tamsulosin (FLOMAX) 0.4 MG CAPS capsule Take 0.4 mg by mouth daily.  . TRESIBA FLEXTOUCH 200 UNIT/ML SOPN Inject 50 Units into the skin daily.     Allergies:   Patient has no known allergies.   Social History   Socioeconomic History  . Marital status: Married    Spouse name: Not on file  . Number of children: Not on file  . Years of education: Not on file  . Highest education level: Not on file  Occupational History  . Not on file  Social Needs  . Financial resource strain: Not on file  . Food insecurity:    Worry: Not on file    Inability: Not on file  . Transportation needs:    Medical: Not on file    Non-medical: Not on file  Tobacco Use  . Smoking status: Never Smoker  . Smokeless tobacco: Current User    Types: Snuff  Substance and Sexual Activity  . Alcohol use: No  . Drug use: No  . Sexual activity: Not on file  Lifestyle  . Physical activity:    Days per week: Not on file    Minutes per session: Not on file  . Stress: Not  on file  Relationships  . Social connections:    Talks on phone: Not on file    Gets together: Not on file    Attends religious service: Not on file    Active member of club or organization: Not on file    Attends meetings of clubs or organizations: Not on file    Relationship status: Not on file  Other Topics Concern  . Not on file  Social History Narrative  . Not on file     Family History: The patient's family history includes Emphysema in his father; Heart attack in his brother and mother; Heart disease in his father and mother. ROS:   Please see the history of present illness.    All 14 point review of systems negative except as described per history of present illness  EKGs/Labs/Other Studies Reviewed:      Recent Labs: No results found for requested labs within last 8760  hours.  Recent Lipid Panel No results found for: CHOL, TRIG, HDL, CHOLHDL, VLDL, LDLCALC, LDLDIRECT  Physical Exam:    VS:  BP 116/64   Pulse 77   Ht 5\' 7"  (1.702 m)   Wt 294 lb 12.8 oz (133.7 kg)   SpO2 93%   BMI 46.17 kg/m     Wt Readings from Last 3 Encounters:  02/25/18 294 lb 12.8 oz (133.7 kg)  08/27/14 (!) 303 lb 3.2 oz (137.5 kg)     GEN:  Well nourished, well developed in no acute distress HEENT: Normal NECK: No JVD; No carotid bruits LYMPHATICS: No lymphadenopathy CARDIAC: RRR, sys ejection murmur 3/6 rad to neck, no rubs, no gallops RESPIRATORY:  Clear to auscultation without rales, wheezing or rhonchi  ABDOMEN: Soft, non-tender, non-distended MUSCULOSKELETAL:  No edema; No deformity  SKIN: Warm and dry LOWER EXTREMITIES: no swelling NEUROLOGIC:  Alert and oriented x 3 PSYCHIATRIC:  Normal affect   ASSESSMENT:    1. Bicuspid aortic valve   2. Nonrheumatic aortic valve stenosis   3. Coronary artery disease involving native coronary artery of native heart without angina pectoris   4. Palpitations   5. Pure hypercholesterolemia    PLAN:    In order of problems listed above:  AS - will do echocardiogram  CAD - stable, asymptomatic  PAlpitaions - denies having any  Dyslipidemia,  Will call PMD to get a cope of FLP       Medication Adjustments/Labs and Tests Ordered: Current medicines are reviewed at length with the patient today.  Concerns regarding medicines are outlined above.  No orders of the defined types were placed in this encounter.  Medication changes: No orders of the defined types were placed in this encounter.   Signed, Park Liter, MD, [facility] 02/25/2018 10:38 AM    [place]

## 2018-02-25 NOTE — Patient Instructions (Signed)
Medication Instructions:   Your physician recommends that you continue on your current medications as directed. Please refer to the Current Medication list given to you today.  If you need a refill on your cardiac medications before your next appointment, please call your pharmacy.   Lab work:  NONE  If you have labs (blood work) drawn today and your tests are completely normal, you will receive your results only by: Marland Kitchen MyChart Message (if you have MyChart) OR . A paper copy in the mail If you have any lab test that is abnormal or we need to change your treatment, we will call you to review the results.  Testing/Procedures:  Your physician has requested that you have an echocardiogram. Echocardiography is a painless test that uses sound waves to create images of your heart. It provides your doctor with information about the size and shape of your heart and how well your heart's chambers and valves are working. This procedure takes approximately one hour. There are no restrictions for this procedure.   Follow-Up: At [facility], you and your health needs are our priority.  As part of our continuing mission to provide you with exceptional heart care, we have created designated Provider Care Teams.  These Care Teams include your primary Cardiologist (physician) and Advanced Practice Providers (APPs -  Physician Assistants and Nurse Practitioners) who all work together to provide you with the care you need, when you need it.  . You will need a follow up appointment in 3 months.

## 2018-02-25 NOTE — Addendum Note (Signed)
Addended by: Orland Penman on: 02/25/2018 10:59 AM   Modules accepted: Orders

## 2018-02-26 ENCOUNTER — Ambulatory Visit (INDEPENDENT_AMBULATORY_CARE_PROVIDER_SITE_OTHER): Payer: Medicare Other

## 2018-02-26 DIAGNOSIS — Q231 Congenital insufficiency of aortic valve: Secondary | ICD-10-CM | POA: Diagnosis not present

## 2018-02-26 DIAGNOSIS — I2510 Atherosclerotic heart disease of native coronary artery without angina pectoris: Secondary | ICD-10-CM | POA: Diagnosis not present

## 2018-02-26 NOTE — Progress Notes (Signed)
Complete echocardiogram has been performed.  Jimmy Macsen Nuttall RDCS, RVT 

## 2018-03-09 DIAGNOSIS — E782 Mixed hyperlipidemia: Secondary | ICD-10-CM | POA: Diagnosis not present

## 2018-03-09 DIAGNOSIS — M5441 Lumbago with sciatica, right side: Secondary | ICD-10-CM | POA: Diagnosis not present

## 2018-03-09 DIAGNOSIS — Z5181 Encounter for therapeutic drug level monitoring: Secondary | ICD-10-CM | POA: Diagnosis not present

## 2018-03-09 DIAGNOSIS — G8929 Other chronic pain: Secondary | ICD-10-CM | POA: Diagnosis not present

## 2018-03-09 DIAGNOSIS — E1169 Type 2 diabetes mellitus with other specified complication: Secondary | ICD-10-CM | POA: Diagnosis not present

## 2018-04-09 DIAGNOSIS — M545 Low back pain: Secondary | ICD-10-CM | POA: Diagnosis not present

## 2018-04-09 DIAGNOSIS — G8929 Other chronic pain: Secondary | ICD-10-CM | POA: Diagnosis not present

## 2018-04-09 DIAGNOSIS — Z5181 Encounter for therapeutic drug level monitoring: Secondary | ICD-10-CM | POA: Diagnosis not present

## 2018-04-09 DIAGNOSIS — Z794 Long term (current) use of insulin: Secondary | ICD-10-CM | POA: Diagnosis not present

## 2018-04-09 DIAGNOSIS — E1165 Type 2 diabetes mellitus with hyperglycemia: Secondary | ICD-10-CM | POA: Diagnosis not present

## 2018-05-08 DIAGNOSIS — M5442 Lumbago with sciatica, left side: Secondary | ICD-10-CM | POA: Diagnosis not present

## 2018-05-08 DIAGNOSIS — G8929 Other chronic pain: Secondary | ICD-10-CM | POA: Diagnosis not present

## 2018-05-08 DIAGNOSIS — E1169 Type 2 diabetes mellitus with other specified complication: Secondary | ICD-10-CM | POA: Diagnosis not present

## 2018-05-08 DIAGNOSIS — M5441 Lumbago with sciatica, right side: Secondary | ICD-10-CM | POA: Diagnosis not present

## 2018-05-08 DIAGNOSIS — Z5181 Encounter for therapeutic drug level monitoring: Secondary | ICD-10-CM | POA: Diagnosis not present

## 2018-05-11 DIAGNOSIS — E109 Type 1 diabetes mellitus without complications: Secondary | ICD-10-CM | POA: Diagnosis not present

## 2018-05-11 DIAGNOSIS — Z794 Long term (current) use of insulin: Secondary | ICD-10-CM | POA: Diagnosis not present

## 2018-05-19 ENCOUNTER — Telehealth: Payer: Self-pay | Admitting: Cardiology

## 2018-05-19 NOTE — Telephone Encounter (Signed)
Patient agreed.  Virtual Visit Pre-Appointment Phone Call  Steps For Call:  1. Confirm consent - "In the setting of the current Covid19 crisis, you are scheduled for a (phone or video) visit with your provider on (date) at (time).  Just as we do with many in-office visits, in order for you to participate in this visit, we must obtain consent.  If you'd like, I can send this to your mychart (if signed up) or email for you to review.  Otherwise, I can obtain your verbal consent now.  All virtual visits are billed to your insurance company just like a normal visit would be.  By agreeing to a virtual visit, we'd like you to understand that the technology does not allow for your provider to perform an examination, and thus may limit your provider's ability to fully assess your condition.  Finally, though the technology is pretty good, we cannot assure that it will always work on either your or our end, and in the setting of a video visit, we may have to convert it to a phone-only visit.  In either situation, we cannot ensure that we have a secure connection.  Are you willing to proceed?"  2. Give patient instructions for WebEx download to smartphone as below if video visit  3. Advise patient to be prepared with any vital sign or heart rhythm information, their current medicines, and a piece of paper and pen handy for any instructions they may receive the day of their visit  4. Inform patient they will receive a phone call 15 minutes prior to their appointment time (may be from unknown caller ID) so they should be prepared to answer  5. Confirm that appointment type is correct in Epic appointment notes (video vs telephone)    TELEPHONE CALL NOTE  John Tucker has been deemed a candidate for a follow-up tele-health visit to limit community exposure during the Covid-19 pandemic. I spoke with the patient via phone to ensure availability of phone/video source, confirm preferred email & phone number, and  discuss instructions and expectations.  I reminded John Tucker to be prepared with any vital sign and/or heart rhythm information that could potentially be obtained via home monitoring, at the time of his visit. I reminded John Tucker to expect a phone call at the time of his visit if his visit.  Did the patient verbally acknowledge consent to treatment? YES  John Tucker 05/19/2018 4:46 PM   DOWNLOADING THE [place], go to [employer] and type in WebEx in the search bar. [place] [employer], the blue/green circle. The app is free but as with any other app downloads, their phone may require them to verify saved payment information or Apple password. The patient does NOT have to create an account.  - If Android, ask patient to go to [employer] and type in WebEx in the search bar. [place] [employer], the blue/green circle. The app is free but as with any other app downloads, their phone may require them to verify saved payment information or Android password. The patient does NOT have to create an account.   CONSENT FOR TELE-HEALTH VISIT - PLEASE REVIEW  I hereby voluntarily request, consent and authorize [place] and its employed or contracted physicians, physician assistants, nurse practitioners or other licensed health care professionals (the Practitioner), to provide me with telemedicine health care services (the Services") as deemed necessary by the treating Practitioner. I acknowledge and  consent to receive the Services by the Practitioner via telemedicine. I understand that the telemedicine visit will involve communicating with the Practitioner through live audiovisual communication technology and the disclosure of certain medical information by electronic transmission. I acknowledge that I have been given the opportunity to request an in-person assessment or other available alternative prior to the telemedicine visit and am  voluntarily participating in the telemedicine visit.  I understand that I have the right to withhold or withdraw my consent to the use of telemedicine in the course of my care at any time, without affecting my right to future care or treatment, and that the Practitioner or I may terminate the telemedicine visit at any time. I understand that I have the right to inspect all information obtained and/or recorded in the course of the telemedicine visit and may receive copies of available information for a reasonable fee.  I understand that some of the potential risks of receiving the Services via telemedicine include:   Delay or interruption in medical evaluation due to technological equipment failure or disruption;  Information transmitted may not be sufficient (e.g. poor resolution of images) to allow for appropriate medical decision making by the Practitioner; and/or   In rare instances, security protocols could fail, causing a breach of personal health information.  Furthermore, I acknowledge that it is my responsibility to provide information about my medical history, conditions and care that is complete and accurate to the best of my ability. I acknowledge that Practitioner's advice, recommendations, and/or decision may be based on factors not within their control, such as incomplete or inaccurate data provided by me or distortions of diagnostic images or specimens that may result from electronic transmissions. I understand that the practice of medicine is not an exact science and that Practitioner makes no warranties or guarantees regarding treatment outcomes. I acknowledge that I will receive a copy of this consent concurrently upon execution via email to the email address I last provided but may also request a printed copy by calling the office of [place].    I understand that my insurance will be billed for this visit.   I have read or had this consent read to me.  I understand the  contents of this consent, which adequately explains the benefits and risks of the Services being provided via telemedicine.   I have been provided ample opportunity to ask questions regarding this consent and the Services and have had my questions answered to my satisfaction.  I give my informed consent for the services to be provided through the use of telemedicine in my medical care  By participating in this telemedicine visit I agree to the above.

## 2018-05-26 ENCOUNTER — Telehealth (INDEPENDENT_AMBULATORY_CARE_PROVIDER_SITE_OTHER): Payer: Medicare Other | Admitting: Cardiology

## 2018-05-26 ENCOUNTER — Encounter: Payer: Self-pay | Admitting: Cardiology

## 2018-05-26 ENCOUNTER — Other Ambulatory Visit: Payer: Self-pay

## 2018-05-26 VITALS — BP 107/73 | HR 58 | Wt 285.0 lb

## 2018-05-26 DIAGNOSIS — I2510 Atherosclerotic heart disease of native coronary artery without angina pectoris: Secondary | ICD-10-CM | POA: Diagnosis not present

## 2018-05-26 DIAGNOSIS — R002 Palpitations: Secondary | ICD-10-CM

## 2018-05-26 DIAGNOSIS — I350 Nonrheumatic aortic (valve) stenosis: Secondary | ICD-10-CM

## 2018-05-26 DIAGNOSIS — Q231 Congenital insufficiency of aortic valve: Secondary | ICD-10-CM

## 2018-05-26 DIAGNOSIS — E7800 Pure hypercholesterolemia, unspecified: Secondary | ICD-10-CM

## 2018-05-26 MED ORDER — ALPRAZOLAM 0.5 MG PO TABS: 0.5000 mg | ORAL_TABLET | Freq: Every day | ORAL | 0 refills | Status: DC | PRN

## 2018-05-26 NOTE — Progress Notes (Signed)
Virtual Visit via Telephone Note   This visit type was conducted due to national recommendations for restrictions regarding the COVID-19 Pandemic (e.g. social distancing) in an effort to limit this patient's exposure and mitigate transmission in our community.  Due to his co-morbid illnesses, this patient is at least at moderate risk for complications without adequate follow up.  This format is felt to be most appropriate for this patient at this time.  The patient did not have access to video technology/had technical difficulties with video requiring transitioning to audio format only (telephone).  All issues noted in this document were discussed and addressed.  No physical exam could be performed with this format.  Please refer to the patient's chart for his  consent to telehealth for [facility].  Evaluation Performed:  Follow-up visit  This visit type was conducted due to national recommendations for restrictions regarding the COVID-19 Pandemic (e.g. social distancing).  This format is felt to be most appropriate for this patient at this time.  All issues noted in this document were discussed and addressed.  No physical exam was performed (except for noted visual exam findings with Video Visits).  Please refer to the patient's chart (MyChart message for video visits and phone note for telephone visits) for the patient's consent to telehealth for [facility].  Date:  05/26/2018  ID: John Tucker, DOB 09-15-1940, MRN [id number]   Patient Location:  [place] [address] [place]  [id number]   Provider location:   [place] Office  PCP:  John Brothers, MD  Cardiologist:  John Campus, MD     Chief Complaint: I am constipated  History of Present Illness:    John Tucker is a 78 y.o. male  who presents via audio/video conferencing for a telehealth visit today.  With coronary artery disease, bicuspid arctic valve, aortic stenosis he had a phone visit today to talk  about his problems overall the biggest problem for him right now is the fact that he is constipated.  He is talking to his primary care physician regarding that issue and he used some milk of magnesia him yesterday with good relief today.  Denies having any tightness squeezing pressure been chest no palpitations no dizziness no swelling of lower extremities.  Few months ago he got to go to different state he was driving his car and he had no problem doing it.  Now he staying in his apartment because of COVID-19 precaution.  He is very scared of the situation we had a long discussion regarding that issue he want me to give you some medication to help him sleep.  I will send prescription for 0.5 mg Xanax 15 tablets.  We discussed the issue of his aortic stenosis which likely still just moderate.  I warned him about potential signs and symptoms of worsening of the problem and he stated he will let me know if he develops those.   The patient does not have symptoms concerning for COVID-19 infection (fever, chills, cough, or new SHORTNESS OF BREATH).    Prior CV studies:   The following studies were reviewed today:       Past Medical History:  Diagnosis Date  . Acute respiratory failure with hypoxia and hypercarbia ([place]) 08/24/2014  . Back pain   . Bicuspid aortic valve 09/20/2014  . Cancer ([place])   . Coronary artery disease involving native coronary artery of native heart without angina pectoris 06/14/2015   Calcification of coronary arteries on CT  .  Diabetes mellitus without complication ([place])   . Hypertension   . Malignant essential hypertension 09/20/2014  . Obesity 09/20/2014  . Palpitations 09/20/2014  . Pre-operative cardiovascular examination 09/20/2014  . Premature atrial complex 09/20/2014  . Pure hypercholesterolemia 09/20/2014  . Renal disorder   . Spinal stenosis, thoracic     Past Surgical History:  Procedure Laterality Date  . HERNIA REPAIR    . NEPHRECTOMY    . ROTATOR CUFF REPAIR        Current Meds  Medication Sig  . aspirin 325 MG tablet Take 325 mg by mouth daily.  . CRESTOR 10 MG tablet Take 10 mg by mouth daily.  Marland Kitchen FARXIGA 10 MG TABS tablet Take 1 tablet by mouth daily.  . furosemide (LASIX) 80 MG tablet Take 1 tablet by mouth daily.  Marland Kitchen glimepiride (AMARYL) 4 MG tablet Take 4 mg by mouth daily.  . metoprolol succinate (TOPROL-XL) 100 MG 24 hr tablet Take 100 mg by mouth daily.  Marland Kitchen oxyCODONE (OXYCONTIN) 10 mg 12 hr tablet Take by mouth.  Marland Kitchen oxymorphone (OPANA ER) 20 MG 12 hr tablet Take 1 tablet by mouth 2 (two) times daily.  . tamsulosin (FLOMAX) 0.4 MG CAPS capsule Take 0.4 mg by mouth daily.  . TRESIBA FLEXTOUCH 200 UNIT/ML SOPN Inject 50 Units into the skin daily.      Family History: The patient's family history includes Emphysema in his father; Heart attack in his brother and mother; Heart disease in his father and mother.   ROS:   Please see the history of present illness.     All other systems reviewed and are negative.   Labs/Other Tests and Data Reviewed:     Recent Labs: No results found for requested labs within last 8760 hours.  Recent Lipid Panel No results found for: CHOL, TRIG, HDL, CHOLHDL, VLDL, LDLCALC, LDLDIRECT    Exam:    Vital Signs:  BP 107/73   Pulse (!) 58   Wt 285 lb (129.3 kg)   BMI 44.64 kg/m     Wt Readings from Last 3 Encounters:  05/26/18 285 lb (129.3 kg)  02/25/18 294 lb 12.8 oz (133.7 kg)  08/27/14 (!) 303 lb 3.2 oz (137.5 kg)     Well nourished, well developed male in no acute distress. Alert awake oriented x3 answering questions appropriately.  Diagnosis for this visit:   1. Coronary artery disease involving native coronary artery of native heart without angina pectoris   2. Bicuspid aortic valve   3. Nonrheumatic aortic valve stenosis   4. Palpitations   5. Pure hypercholesterolemia      ASSESSMENT & PLAN:    1.  Coronary artery disease.  Stable no chest pain tightness squeezing pressure  burning chest 2.  Bicuspid aortic valve.  Valve moderately stenotic with mean gradient of 26 mmHg.  We will continue monitoring.  He will require yearly echocardiogram. 3.  Nonrheumatic aortic valve stenosis moderate only. 4.  Palpitations denies having any.  5.  Dyslipidemia.  On medications which I will continue  COVID-19 Education: The signs and symptoms of COVID-19 were discussed with the patient and how to seek care for testing (follow up with PCP or arrange E-visit).  The importance of social distancing was discussed today.  Patient Risk:   After full review of this patients clinical status, I feel that they are at least moderate risk at this time.  Time:   Today, I have spent 16 minutes with the patient with telehealth technology  discussing pt health issues. Visit was finished at 1:43 PM.  I spent 5 minutes reviewing chart before the visit    Medication Adjustments/Labs and Tests Ordered: Current medicines are reviewed at length with the patient today.  Concerns regarding medicines are outlined above.  No orders of the defined types were placed in this encounter.  Medication changes: No orders of the defined types were placed in this encounter.    Disposition: 3 months follow-up  Signed, Park Liter, MD, [facility] 05/26/2018 1:40 PM    [place] '

## 2018-05-26 NOTE — Addendum Note (Signed)
Addended by: Particia Nearing B on: 05/26/2018 01:52 PM   Modules accepted: Orders

## 2018-05-26 NOTE — Patient Instructions (Signed)
Medication Instructions:  Your physician recommends that you continue on your current medications as directed. Please refer to the Current Medication list given to you today.  If you need a refill on your cardiac medications before your next appointment, please call your pharmacy.   Lab work: None.  If you have labs (blood work) drawn today and your tests are completely normal, you will receive your results only by: . MyChart Message (if you have MyChart) OR . A paper copy in the mail If you have any lab test that is abnormal or we need to change your treatment, we will call you to review the results.  Testing/Procedures: None.   Follow-Up: At [facility], you and your health needs are our priority.  As part of our continuing mission to provide you with exceptional heart care, we have created designated Provider Care Teams.  These Care Teams include your primary Cardiologist (physician) and Advanced Practice Providers (APPs -  Physician Assistants and Nurse Practitioners) who all work together to provide you with the care you need, when you need it. You will need a follow up appointment in 3 months.  Please call our office 2 months in advance to schedule this appointment.  You may see No primary care provider on file. or another member of our [facility] Provider Team in [place]: Brian Munley, MD . Rajan Revankar, MD  Any Other Special Instructions Will Be Listed Below (If Applicable).     

## 2018-06-08 DIAGNOSIS — E782 Mixed hyperlipidemia: Secondary | ICD-10-CM | POA: Diagnosis not present

## 2018-06-08 DIAGNOSIS — G8929 Other chronic pain: Secondary | ICD-10-CM | POA: Diagnosis not present

## 2018-06-08 DIAGNOSIS — M5441 Lumbago with sciatica, right side: Secondary | ICD-10-CM | POA: Diagnosis not present

## 2018-06-08 DIAGNOSIS — E1169 Type 2 diabetes mellitus with other specified complication: Secondary | ICD-10-CM | POA: Diagnosis not present

## 2018-06-08 DIAGNOSIS — Z5181 Encounter for therapeutic drug level monitoring: Secondary | ICD-10-CM | POA: Diagnosis not present

## 2018-07-08 DIAGNOSIS — I10 Essential (primary) hypertension: Secondary | ICD-10-CM | POA: Diagnosis not present

## 2018-07-08 DIAGNOSIS — M5441 Lumbago with sciatica, right side: Secondary | ICD-10-CM | POA: Diagnosis not present

## 2018-07-08 DIAGNOSIS — G8929 Other chronic pain: Secondary | ICD-10-CM | POA: Diagnosis not present

## 2018-07-08 DIAGNOSIS — Z5181 Encounter for therapeutic drug level monitoring: Secondary | ICD-10-CM | POA: Diagnosis not present

## 2018-08-10 DIAGNOSIS — Z5181 Encounter for therapeutic drug level monitoring: Secondary | ICD-10-CM | POA: Diagnosis not present

## 2018-08-10 DIAGNOSIS — M5441 Lumbago with sciatica, right side: Secondary | ICD-10-CM | POA: Diagnosis not present

## 2018-08-10 DIAGNOSIS — I10 Essential (primary) hypertension: Secondary | ICD-10-CM | POA: Diagnosis not present

## 2018-08-10 DIAGNOSIS — R600 Localized edema: Secondary | ICD-10-CM | POA: Diagnosis not present

## 2018-08-10 DIAGNOSIS — G8929 Other chronic pain: Secondary | ICD-10-CM | POA: Diagnosis not present

## 2018-08-11 DIAGNOSIS — Z794 Long term (current) use of insulin: Secondary | ICD-10-CM | POA: Diagnosis not present

## 2018-08-11 DIAGNOSIS — E109 Type 1 diabetes mellitus without complications: Secondary | ICD-10-CM | POA: Diagnosis not present

## 2018-08-24 DIAGNOSIS — I10 Essential (primary) hypertension: Secondary | ICD-10-CM | POA: Diagnosis not present

## 2018-08-27 ENCOUNTER — Ambulatory Visit: Payer: Medicare Other | Admitting: Cardiology

## 2018-09-09 ENCOUNTER — Other Ambulatory Visit: Payer: Self-pay

## 2018-09-09 ENCOUNTER — Encounter: Payer: Self-pay | Admitting: Cardiology

## 2018-09-09 ENCOUNTER — Telehealth (INDEPENDENT_AMBULATORY_CARE_PROVIDER_SITE_OTHER): Payer: Medicare Other | Admitting: Cardiology

## 2018-09-09 VITALS — BP 124/68 | Wt 280.0 lb

## 2018-09-09 DIAGNOSIS — I10 Essential (primary) hypertension: Secondary | ICD-10-CM | POA: Diagnosis not present

## 2018-09-09 DIAGNOSIS — Q231 Congenital insufficiency of aortic valve: Secondary | ICD-10-CM

## 2018-09-09 DIAGNOSIS — M5442 Lumbago with sciatica, left side: Secondary | ICD-10-CM | POA: Diagnosis not present

## 2018-09-09 DIAGNOSIS — E7800 Pure hypercholesterolemia, unspecified: Secondary | ICD-10-CM

## 2018-09-09 DIAGNOSIS — G8929 Other chronic pain: Secondary | ICD-10-CM | POA: Diagnosis not present

## 2018-09-09 DIAGNOSIS — Z5181 Encounter for therapeutic drug level monitoring: Secondary | ICD-10-CM | POA: Diagnosis not present

## 2018-09-09 DIAGNOSIS — M5441 Lumbago with sciatica, right side: Secondary | ICD-10-CM | POA: Diagnosis not present

## 2018-09-09 DIAGNOSIS — I350 Nonrheumatic aortic (valve) stenosis: Secondary | ICD-10-CM

## 2018-09-09 DIAGNOSIS — I2510 Atherosclerotic heart disease of native coronary artery without angina pectoris: Secondary | ICD-10-CM | POA: Diagnosis not present

## 2018-09-09 MED ORDER — ALPRAZOLAM 0.5 MG PO TABS: 0.5000 mg | ORAL_TABLET | Freq: Every day | ORAL | 0 refills | Status: AC | PRN

## 2018-09-09 NOTE — Patient Instructions (Signed)
Medication Instructions:  Your physician recommends that you continue on your current medications as directed. Please refer to the Current Medication list given to you today.  If you need a refill on your cardiac medications before your next appointment, please call your pharmacy.   Lab work: None.  If you have labs (blood work) drawn today and your tests are completely normal, you will receive your results only by: . MyChart Message (if you have MyChart) OR . A paper copy in the mail If you have any lab test that is abnormal or we need to change your treatment, we will call you to review the results.  Testing/Procedures: None.   Follow-Up: At [facility], you and your health needs are our priority.  As part of our continuing mission to provide you with exceptional heart care, we have created designated Provider Care Teams.  These Care Teams include your primary Cardiologist (physician) and Advanced Practice Providers (APPs -  Physician Assistants and Nurse Practitioners) who all work together to provide you with the care you need, when you need it. You will need a follow up appointment in 5 months.  Please call our office 2 months in advance to schedule this appointment.  You may see No primary care provider on file. or another member of our [facility] Provider Team in [place]: Brian Munley, MD . Rajan Revankar, MD  Any Other Special Instructions Will Be Listed Below (If Applicable).    

## 2018-09-09 NOTE — Progress Notes (Signed)
Virtual Visit via Telephone Note   This visit type was conducted due to national recommendations for restrictions regarding the COVID-19 Pandemic (e.g. social distancing) in an effort to limit this patient's exposure and mitigate transmission in our community.  Due to his co-morbid illnesses, this patient is at least at moderate risk for complications without adequate follow up.  This format is felt to be most appropriate for this patient at this time.  The patient did not have access to video technology/had technical difficulties with video requiring transitioning to audio format only (telephone).  All issues noted in this document were discussed and addressed.  No physical exam could be performed with this format.  Please refer to the patient's chart for his  consent to telehealth for [facility].  Evaluation Performed:  Follow-up visit  This visit type was conducted due to national recommendations for restrictions regarding the COVID-19 Pandemic (e.g. social distancing).  This format is felt to be most appropriate for this patient at this time.  All issues noted in this document were discussed and addressed.  No physical exam was performed (except for noted visual exam findings with Video Visits).  Please refer to the patient's chart (MyChart message for video visits and phone note for telephone visits) for the patient's consent to telehealth for [facility].  Date:  09/09/2018  ID: John Tucker, DOB 1940-02-21, MRN [id number]   Patient Location: [place] [address] [place] [address]   Provider location:   [place] Office  PCP:  Garwin Brothers, MD  Cardiologist:  Jenne Campus, MD     Chief Complaint: Complaining of a lot of back pain which is a chronic complaint  History of Present Illness:    John Tucker is a 78 y.o. male  who presents via audio/video conferencing for a telehealth visit today.  Past medical history significant for bicuspid aortic valve  last assessment at the beginning of 2020 showed a moderate stenosis.  He does have a chronic back pain coronary artery disease as proven by calcification of coronary arteries based on CT from 2017.  Diabetes, hypertension.  Overall gallop visit today with me.  He complained vividly about his back pain.  He complains about the fact that he cannot get enough pain medication to help with that.  He is asking me again to give him some Xanax I gave him last time months ago just few tablets he used them all.  I reminded to give him no more than 15 tablets of Xanax and ask him to use it carefully.  I before we do that however I called pharmacy make sure he does not get Xanax from different source.  Denies having any cardiac complaints.  No chest pain no tightness no squeezing no pressure no burning in her chest   The patient does not have symptoms concerning for COVID-19 infection (fever, chills, cough, or new SHORTNESS OF BREATH).    Prior CV studies:   The following studies were reviewed today:  .     Past Medical History:  Diagnosis Date  . Acute respiratory failure with hypoxia and hypercarbia ([place]) 08/24/2014  . Back pain   . Bicuspid aortic valve 09/20/2014  . Cancer ([place])   . Coronary artery disease involving native coronary artery of native heart without angina pectoris 06/14/2015   Calcification of coronary arteries on CT  . Diabetes mellitus without complication ([place])   . Hypertension   . Malignant essential hypertension 09/20/2014  .  Obesity 09/20/2014  . Palpitations 09/20/2014  . Pre-operative cardiovascular examination 09/20/2014  . Premature atrial complex 09/20/2014  . Pure hypercholesterolemia 09/20/2014  . Renal disorder   . Spinal stenosis, thoracic     Past Surgical History:  Procedure Laterality Date  . HERNIA REPAIR    . NEPHRECTOMY    . ROTATOR CUFF REPAIR       Current Meds  Medication Sig  . ALPRAZolam (XANAX) 0.5 MG tablet Take 1 tablet (0.5 mg total) by mouth daily as  needed for anxiety.  Marland Kitchen aspirin 325 MG tablet Take 325 mg by mouth daily.  . CRESTOR 10 MG tablet Take 10 mg by mouth daily.  Marland Kitchen FARXIGA 10 MG TABS tablet Take 1 tablet by mouth daily.  . furosemide (LASIX) 80 MG tablet Take 1 tablet by mouth daily.  Marland Kitchen glimepiride (AMARYL) 4 MG tablet Take 4 mg by mouth daily.  . metoprolol succinate (TOPROL-XL) 100 MG 24 hr tablet Take 100 mg by mouth daily.  Marland Kitchen oxymorphone (OPANA ER) 20 MG 12 hr tablet Take 1 tablet by mouth 2 (two) times daily.  Marland Kitchen OZEMPIC, 0.25 OR 0.5 MG/DOSE, 2 MG/1.5ML SOPN once a week.  . tamsulosin (FLOMAX) 0.4 MG CAPS capsule Take 0.4 mg by mouth daily.      Family History: The patient's family history includes Emphysema in his father; Heart attack in his brother and mother; Heart disease in his father and mother.   ROS:   Please see the history of present illness.     All other systems reviewed and are negative.   Labs/Other Tests and Data Reviewed:     Recent Labs: No results found for requested labs within last 8760 hours.  Recent Lipid Panel No results found for: CHOL, TRIG, HDL, CHOLHDL, VLDL, LDLCALC, LDLDIRECT    Exam:    Vital Signs:  BP 124/68   Wt 280 lb (127 kg)   BMI 43.85 kg/m     Wt Readings from Last 3 Encounters:  09/09/18 280 lb (127 kg)  05/26/18 285 lb (129.3 kg)  02/25/18 294 lb 12.8 oz (133.7 kg)     Well nourished, well developed in no acute distress. Alert oriented x3 unable to establish video link therefore we talk over the phone.  Not in any distress at the time of my conversation  Diagnosis for this visit:   1. Coronary artery disease involving native coronary artery of native heart without angina pectoris   2. Bicuspid aortic valve   3. Nonrheumatic aortic valve stenosis   4. Pure hypercholesterolemia      ASSESSMENT & PLAN:    1.  Coronary disease calcification on CT.  Asymptomatic but living very sedentary lifestyle.  We will continue risk factors modifications. 3.   Bicuspid aortic valve last echocardiogram showing no critical lesions.  Will require another echocardiogram within a year.  Asymptomatic, no chest pain no dizziness no passing out no shortness of breath. 3.  Dyslipidemia.  We will continue present management.   COVID-19 Education: The signs and symptoms of COVID-19 were discussed with the patient and how to seek care for testing (follow up with PCP or arrange E-visit).  The importance of social distancing was discussed today.  Patient Risk:   After full review of this patients clinical status, I feel that they are at least moderate risk at this time.  Time:   Today, I have spent 15 minutes with the patient with telehealth technology discussing pt health issues.  I spent 5  minutes reviewing her chart before the visit.  Visit was finished at 1:22 PM.    Medication Adjustments/Labs and Tests Ordered: Current medicines are reviewed at length with the patient today.  Concerns regarding medicines are outlined above.  No orders of the defined types were placed in this encounter.  Medication changes: No orders of the defined types were placed in this encounter.    Disposition: Follow-up in 5 months  Signed, Park Liter, MD, [facility] 09/09/2018 1:24 PM    [place]

## 2018-09-09 NOTE — Addendum Note (Signed)
Addended by: Ashok Norris on: 09/09/2018 01:55 PM   Modules accepted: Orders

## 2018-10-12 DIAGNOSIS — E782 Mixed hyperlipidemia: Secondary | ICD-10-CM | POA: Diagnosis not present

## 2018-10-12 DIAGNOSIS — I10 Essential (primary) hypertension: Secondary | ICD-10-CM | POA: Diagnosis not present

## 2018-10-12 DIAGNOSIS — E1169 Type 2 diabetes mellitus with other specified complication: Secondary | ICD-10-CM | POA: Diagnosis not present

## 2018-10-12 DIAGNOSIS — Z5181 Encounter for therapeutic drug level monitoring: Secondary | ICD-10-CM | POA: Diagnosis not present

## 2018-10-12 DIAGNOSIS — G8929 Other chronic pain: Secondary | ICD-10-CM | POA: Diagnosis not present

## 2018-10-12 DIAGNOSIS — M5441 Lumbago with sciatica, right side: Secondary | ICD-10-CM | POA: Diagnosis not present

## 2018-11-11 DIAGNOSIS — E109 Type 1 diabetes mellitus without complications: Secondary | ICD-10-CM | POA: Diagnosis not present

## 2018-11-11 DIAGNOSIS — Z794 Long term (current) use of insulin: Secondary | ICD-10-CM | POA: Diagnosis not present

## 2018-11-12 DIAGNOSIS — Z794 Long term (current) use of insulin: Secondary | ICD-10-CM | POA: Diagnosis not present

## 2018-11-12 DIAGNOSIS — E109 Type 1 diabetes mellitus without complications: Secondary | ICD-10-CM | POA: Diagnosis not present

## 2018-11-16 DIAGNOSIS — E1169 Type 2 diabetes mellitus with other specified complication: Secondary | ICD-10-CM | POA: Diagnosis not present

## 2018-11-16 DIAGNOSIS — M5442 Lumbago with sciatica, left side: Secondary | ICD-10-CM | POA: Diagnosis not present

## 2018-11-16 DIAGNOSIS — M5441 Lumbago with sciatica, right side: Secondary | ICD-10-CM | POA: Diagnosis not present

## 2018-11-16 DIAGNOSIS — Z23 Encounter for immunization: Secondary | ICD-10-CM | POA: Diagnosis not present

## 2018-11-16 DIAGNOSIS — I10 Essential (primary) hypertension: Secondary | ICD-10-CM | POA: Diagnosis not present

## 2018-11-16 DIAGNOSIS — Z5181 Encounter for therapeutic drug level monitoring: Secondary | ICD-10-CM | POA: Diagnosis not present

## 2018-12-20 DEATH — deceased

## 2019-02-03 ENCOUNTER — Ambulatory Visit: Payer: Medicare Other | Admitting: Cardiology

## 2019-12-20 DEATH — deceased

## 2020-12-19 DEATH — deceased
